# Patient Record
Sex: Female | Born: 1980
Health system: Southern US, Community
[De-identification: ages and names within clinical notes are randomized; demographics above are authoritative.]

## PROBLEM LIST (undated history)

## (undated) DIAGNOSIS — K219 Gastro-esophageal reflux disease without esophagitis: Secondary | ICD-10-CM

## (undated) DIAGNOSIS — N912 Amenorrhea, unspecified: Secondary | ICD-10-CM

## (undated) DIAGNOSIS — R112 Nausea with vomiting, unspecified: Secondary | ICD-10-CM

## (undated) DIAGNOSIS — M3500 Sicca syndrome, unspecified: Secondary | ICD-10-CM

## (undated) DIAGNOSIS — N920 Excessive and frequent menstruation with regular cycle: Secondary | ICD-10-CM

## (undated) DIAGNOSIS — T8859XA Other complications of anesthesia, initial encounter: Secondary | ICD-10-CM

## (undated) DIAGNOSIS — T4145XA Adverse effect of unspecified anesthetic, initial encounter: Secondary | ICD-10-CM

## (undated) DIAGNOSIS — G43109 Migraine with aura, not intractable, without status migrainosus: Secondary | ICD-10-CM

## (undated) DIAGNOSIS — T753XXA Motion sickness, initial encounter: Secondary | ICD-10-CM

## (undated) DIAGNOSIS — R102 Pelvic and perineal pain: Secondary | ICD-10-CM

## (undated) DIAGNOSIS — R51 Headache: Secondary | ICD-10-CM

## (undated) DIAGNOSIS — E282 Polycystic ovarian syndrome: Secondary | ICD-10-CM

## (undated) DIAGNOSIS — N200 Calculus of kidney: Secondary | ICD-10-CM

## (undated) DIAGNOSIS — M545 Low back pain: Secondary | ICD-10-CM

## (undated) DIAGNOSIS — Z8489 Family history of other specified conditions: Secondary | ICD-10-CM

## (undated) DIAGNOSIS — F419 Anxiety disorder, unspecified: Secondary | ICD-10-CM

## (undated) DIAGNOSIS — O021 Missed abortion: Secondary | ICD-10-CM

## (undated) DIAGNOSIS — R519 Headache, unspecified: Secondary | ICD-10-CM

## (undated) DIAGNOSIS — J45909 Unspecified asthma, uncomplicated: Secondary | ICD-10-CM

## (undated) DIAGNOSIS — Z973 Presence of spectacles and contact lenses: Secondary | ICD-10-CM

## (undated) DIAGNOSIS — Z9889 Other specified postprocedural states: Secondary | ICD-10-CM

## (undated) HISTORY — DX: Amenorrhea, unspecified: N91.2

## (undated) HISTORY — PX: NASAL SEPTUM SURGERY: SHX37

## (undated) HISTORY — PX: TYMPANOSTOMY TUBE PLACEMENT: SHX32

## (undated) HISTORY — DX: Missed abortion: O02.1

## (undated) HISTORY — PX: WISDOM TOOTH EXTRACTION: SHX21

## (undated) HISTORY — DX: Calculus of kidney: N20.0

## (undated) HISTORY — DX: Headache, unspecified: R51.9

## (undated) HISTORY — DX: Migraine with aura, not intractable, without status migrainosus: G43.109

## (undated) HISTORY — DX: Headache: R51

## (undated) HISTORY — DX: Polycystic ovarian syndrome: E28.2

## (undated) HISTORY — DX: Pelvic and perineal pain: R10.2

## (undated) HISTORY — DX: Excessive and frequent menstruation with regular cycle: N92.0

## (undated) HISTORY — DX: Anxiety disorder, unspecified: F41.9

---
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
## 2007-11-26 ENCOUNTER — Ambulatory Visit: Payer: Self-pay

## 2010-10-29 ENCOUNTER — Emergency Department: Payer: Self-pay | Admitting: Emergency Medicine

## 2011-03-23 ENCOUNTER — Ambulatory Visit: Payer: Self-pay | Admitting: Otolaryngology

## 2012-06-14 ENCOUNTER — Emergency Department: Payer: Self-pay | Admitting: Emergency Medicine

## 2012-06-14 LAB — COMPREHENSIVE METABOLIC PANEL
Alkaline Phosphatase: 67 U/L (ref 50–136)
Anion Gap: 7 (ref 7–16)
BUN: 10 mg/dL (ref 7–18)
Calcium, Total: 9.1 mg/dL (ref 8.5–10.1)
Chloride: 105 mmol/L (ref 98–107)
EGFR (Non-African Amer.): >60
Glucose: 125 mg/dL — ABNORMAL HIGH (ref 65–99)
Osmolality: 274 (ref 275–301)
Potassium: 3.6 mmol/L (ref 3.5–5.1)
SGPT (ALT): 35 U/L (ref 12–78)

## 2012-06-14 LAB — URINALYSIS, COMPLETE
Bilirubin,UR: NEGATIVE
Blood: NEGATIVE
Nitrite: NEGATIVE
RBC,UR: 1 /HPF (ref 0–5)
Squamous Epithelial: 7
WBC UR: 2 /HPF (ref 0–5)

## 2012-06-14 LAB — CBC
HCT: 42.1 % (ref 35.0–47.0)
HGB: 14.6 g/dL (ref 12.0–16.0)
Platelet: 279 10*3/uL (ref 150–440)
RBC: 4.88 10*6/uL (ref 3.80–5.20)
RDW: 12.7 % (ref 11.5–14.5)
WBC: 7 10*3/uL (ref 3.6–11.0)

## 2012-06-14 LAB — TSH: Thyroid Stimulating Horm: 1.17 u[IU]/mL

## 2013-08-21 ENCOUNTER — Emergency Department: Payer: Self-pay | Admitting: Emergency Medicine

## 2014-06-03 ENCOUNTER — Ambulatory Visit
Admission: RE | Admit: 2014-06-03 | Discharge: 2014-06-03 | Disposition: A | Discharge status: Home / Self Care | Payer: BLUE CROSS/BLUE SHIELD | Source: Ambulatory Visit | Attending: Allergy | Admitting: Allergy

## 2014-06-03 ENCOUNTER — Other Ambulatory Visit: Payer: Self-pay | Admitting: Allergy

## 2014-06-03 DIAGNOSIS — J4530 Mild persistent asthma, uncomplicated: Secondary | ICD-10-CM

## 2016-09-05 ENCOUNTER — Ambulatory Visit: Payer: Self-pay | Admitting: Obstetrics and Gynecology

## 2016-10-04 ENCOUNTER — Ambulatory Visit: Payer: Self-pay | Admitting: Obstetrics and Gynecology

## 2016-10-12 ENCOUNTER — Ambulatory Visit (INDEPENDENT_AMBULATORY_CARE_PROVIDER_SITE_OTHER): Payer: BLUE CROSS/BLUE SHIELD | Admitting: Obstetrics and Gynecology

## 2016-10-12 ENCOUNTER — Encounter: Payer: Self-pay | Admitting: Obstetrics and Gynecology

## 2016-10-12 VITALS — BP 130/90 | HR 96 | Ht 61.0 in | Wt 189.0 lb

## 2016-10-12 DIAGNOSIS — Z30431 Encounter for routine checking of intrauterine contraceptive device: Secondary | ICD-10-CM

## 2016-10-12 DIAGNOSIS — Z01419 Encounter for gynecological examination (general) (routine) without abnormal findings: Secondary | ICD-10-CM

## 2016-10-12 DIAGNOSIS — N921 Excessive and frequent menstruation with irregular cycle: Secondary | ICD-10-CM

## 2016-10-12 NOTE — Progress Notes (Signed)
PCP:  Patient, No Pcp Per   Chief Complaint  Patient presents with  . Gynecologic Exam     HPI:      Eileen Garcia is a 36 y.o. G4P0040 who LMP was Patient's last menstrual period was 09/26/2016., presents today for her annual examination.  Her menses are regular every 28-30 days, lasting 12-14 days.  Dysmenorrhea moderate, occurring premenstrually and during period. Pt takes tylenol with sx relief.  She does nothave intermenstrual bleeding. She has a hx of PCOS.[name] placed due to anovulatory bleeding/menorrhagia with Dr. Laurey Morale. Suspicion of endometriosis given hx of pelvic pain. Failed mult OCPs, ortho evra and depo. Hx of migraines with aura.   Sex activity: single partner, contraception - IUD. Skyla placed 05/25/15. Pt with recent dyspareunia before orgasm. She may want to conceive in near future. Tried metformin in the past for ovulation with hypoglycemia side effects and no effect on menses.   Last Pap: December 22, 2014  Results were: no abnormalities /neg HPV DNA  Hx of STDs: none  There is no FH of breast cancer. There is a FH of uterine vs ovarian cancer in PGM (no chemo/rad done), genetic testing not done. The patient does do self-breast exams.  Tobacco use: The patient denies current or previous tobacco use. Alcohol use: social drinker No drug use.  Exercise: moderately active  She does not get adequate calcium and Vitamin D in her diet.   Past Medical History:  Diagnosis Date  . Amenorrhea   . Anxiety   . Headache   . Menorrhagia   . Migraine with aura   . Missed abortion   . Pelvic pain   . Polycystic ovaries     Past Surgical History:  Procedure Laterality Date  . WISDOM TOOTH EXTRACTION      Family History  Problem Relation Age of Onset  . Hypertension Father   . Ovarian cancer Paternal Grandmother 16       vs uterine--no chemo/rad done  . Migraines Paternal Grandmother   . Diabetes Paternal Grandmother     Social History   Social  History  . Marital status: Single    Spouse name: N/A  . Number of children: N/A  . Years of education: N/A   Occupational History  . Not on file.   Social History Main Topics  . Smoking status: Never Smoker  . Smokeless tobacco: Never Used  . Alcohol use Yes  . Drug use: No  . Sexual activity: Yes    Birth control/ protection: IUD   Other Topics Concern  . Not on file   Social History Narrative  . No narrative on file    Current Meds  Medication Sig  . EPINEPHrine (EPIPEN 2-PAK) 0.3 mg/0.3 mL IJ SOAJ injection Reported on 03/29/2015  . Levonorgestrel 13.5 MG IUD 13.5 mg by Intrauterine route.  . [DISCONTINUED] levonorgestrel (MIRENA) 20 MCG/24HR IUD 1 each by Intrauterine route once.     ROS:  Review of Systems  Constitutional: Positive for fatigue. Negative for fever and unexpected weight change.  Respiratory: Negative for cough, shortness of breath and wheezing.   Cardiovascular: Negative for chest pain, palpitations and leg swelling.  Gastrointestinal: Negative for blood in stool, constipation, diarrhea, nausea and vomiting.  Endocrine: Negative for cold intolerance, heat intolerance and polyuria.  Genitourinary: Positive for dyspareunia and pelvic pain. Negative for dysuria, flank pain, frequency, genital sores, hematuria, menstrual problem, urgency, vaginal bleeding, vaginal discharge and vaginal pain.  Musculoskeletal: Negative for  back pain, joint swelling and myalgias.  Skin: Negative for rash.  Neurological: Negative for dizziness, syncope, light-headedness, numbness and headaches.  Hematological: Negative for adenopathy.  Psychiatric/Behavioral: Negative for agitation, confusion, sleep disturbance and suicidal ideas. The patient is not nervous/anxious.      Objective: BP 130/90   Pulse 96   Ht 5\' 1"  (1.549 m)   Wt 189 lb (85.7 kg)   LMP 09/26/2016   BMI 35.71 kg/m    Physical Exam  Constitutional: She is oriented to person, place, and time. She  appears well-developed and well-nourished.  Genitourinary: Vagina normal and uterus normal. There is no rash, tenderness, lesion or Bartholin's cyst on the right labia. There is no rash, tenderness, lesion or Bartholin's cyst on the left labia. No erythema, tenderness or bleeding in the vagina. No vaginal discharge found. Right adnexum does not display mass and does not display tenderness. Left adnexum does not display mass and does not display tenderness.  Cervix exhibits visible IUD strings. Cervix does not exhibit motion tenderness, discharge, friability or polyp. Uterus is not enlarged or tender.  Neck: Normal range of motion. No thyromegaly present.  Cardiovascular: Normal rate, regular rhythm and normal heart sounds.   No murmur heard. Pulmonary/Chest: Effort normal and breath sounds normal. Right breast exhibits no mass, no nipple discharge, no skin change and no tenderness. Left breast exhibits no mass, no nipple discharge, no skin change and no tenderness.  Abdominal: Soft. There is no tenderness. There is no guarding.  Musculoskeletal: Normal range of motion.  Neurological: She is alert and oriented to person, place, and time. No cranial nerve deficit.  Psychiatric: She has a normal mood and affect. Her behavior is normal.  Nursing note and vitals reviewed.     Assessment/Plan: Encounter for annual routine gynecological examination  Encounter for routine checking of intrauterine contraceptive device (IUD) - IUD in place. Due for rem 4/20.  Menometrorrhagia - Sx improved with IUD.           GYN counsel adequate intake of calcium and vitamin D, diet and exercise     F/U  Return in about 1 year (around 10/12/2017).  Aminah Zabawa B. Gatlin Kittell, PA-C 10/12/2016 9:07 AM

## 2016-11-13 ENCOUNTER — Telehealth: Payer: Self-pay

## 2016-11-13 NOTE — Telephone Encounter (Signed)
This can be normal for [facility]. Let's see if sx persist before giving estrogen OCPs due to hx of migraines with aura. RN to notify pt.

## 2016-11-13 NOTE — Telephone Encounter (Signed)
Pt was seen on 9/6 after a 2wk period, after 9/6 started period for 2wk, stopped for about 1 1/2 -2wks, and has started bleeding agaoin.  The last time this happened she was give an rx for a few bcp to regulate bleeding.  If this can be done again - GREAT!  [facility] [place].  [phone]

## 2016-11-14 ENCOUNTER — Other Ambulatory Visit: Payer: Self-pay | Admitting: Obstetrics and Gynecology

## 2016-11-14 MED ORDER — ESTRADIOL 1 MG PO TABS
1.0000 mg | ORAL_TABLET | Freq: Every day | ORAL | 0 refills | Status: DC
Start: 2016-11-14 — End: 2017-07-19

## 2016-11-14 NOTE — Telephone Encounter (Signed)
Pt states that she was only on the estrogen for a few days over a short period of time and that regulated the periods. Pt also c/o increased and painful cramping. Pt is leaving for [place] tomorrow morning for 5 days and concerned. Bleeding was very heavy and painful 9/23-9/28. Please advise if there is anything else that can be done. Thank you.

## 2016-11-14 NOTE — Telephone Encounter (Signed)
Rx estradiol eRxd. 14 days worth, but take until bleeding stops. RTO if sx persist after 14 tx with no relief. RN to discuss with pt.

## 2016-11-14 NOTE — Progress Notes (Signed)
Rx estradiol for irreg bleeding with Skyla. Helped in the past. Hx of migraines with aura. Use temporarily. F/u if sx persist.

## 2016-11-14 NOTE — Telephone Encounter (Signed)
Pt aware and appreciative  

## 2016-12-14 ENCOUNTER — Ambulatory Visit (INDEPENDENT_AMBULATORY_CARE_PROVIDER_SITE_OTHER): Payer: BLUE CROSS/BLUE SHIELD | Admitting: Obstetrics and Gynecology

## 2016-12-14 ENCOUNTER — Encounter: Payer: Self-pay | Admitting: Obstetrics and Gynecology

## 2016-12-14 VITALS — BP 130/90 | HR 91 | Ht 61.0 in | Wt 189.0 lb

## 2016-12-14 DIAGNOSIS — Z30431 Encounter for routine checking of intrauterine contraceptive device: Secondary | ICD-10-CM | POA: Diagnosis not present

## 2016-12-14 DIAGNOSIS — N921 Excessive and frequent menstruation with irregular cycle: Secondary | ICD-10-CM | POA: Diagnosis not present

## 2016-12-14 DIAGNOSIS — N938 Other specified abnormal uterine and vaginal bleeding: Secondary | ICD-10-CM | POA: Diagnosis not present

## 2016-12-14 DIAGNOSIS — E282 Polycystic ovarian syndrome: Secondary | ICD-10-CM | POA: Insufficient documentation

## 2016-12-14 NOTE — Progress Notes (Signed)
Chief Complaint  Patient presents with  . aub    HPI:      [name] is a 36 y.o. G4P0040 who LMP was Patient's last menstrual period was 12/12/2016., presents today for DUB sx again. She has a long hx of menometrorrhagia/irreg menses, since age 42, due to PCOS. Pt had skyla placed 4/17 for sx and was doing fairly well until 9/18 when she started having Q3 wks of bleeding for about 10 days. Flow is light/medium (changing unfilled pads BID to TID). Pt tried estrogen 1 mg for 14 days without any real change. Pt with hx of migraines with aura so have to limit estrogen use. Pt failed multiple OCPs, xulane and depo in the past. She may want to conceive in the next 2 yrs, so fertility preservation is preferred.   Pt has also noticed increased pelvic pain/dysmen recently, sx improved with NSAIDs. Pt with hx of pelvic pain in the past and endometriosis is suspected by Dr. Glennon Mac. She has seen Dr. Laurey Morale, Glennon Mac and Pinch in the past. GYN u/s 10/17 was normal.   Last annual 9/18. No recent DUB labs done.  Past Medical History:  Diagnosis Date  . Amenorrhea   . Anxiety   . Headache   . Menorrhagia   . Migraine with aura   . Missed abortion   . Pelvic pain   . Polycystic ovaries     Past Surgical History:  Procedure Laterality Date  . WISDOM TOOTH EXTRACTION      Family History  Problem Relation Age of Onset  . Hypertension Father   . Ovarian cancer Paternal Grandmother 38       vs uterine--no chemo/rad done  . Migraines Paternal Grandmother   . Diabetes Paternal Grandmother     Social History   Socioeconomic History  . Marital status: Single    Spouse name: Not on file  . Number of children: Not on file  . Years of education: Not on file  . Highest education level: Not on file  Social Needs  . Financial resource strain: Not on file  . Food insecurity - worry: Not on file  . Food insecurity - inability: Not on file  . Transportation needs - medical: Not on  file  . Transportation needs - non-medical: Not on file  Occupational History  . Not on file  Tobacco Use  . Smoking status: Never Smoker  . Smokeless tobacco: Never Used  Substance and Sexual Activity  . Alcohol use: Yes  . Drug use: No  . Sexual activity: Yes    Birth control/protection: IUD  Other Topics Concern  . Not on file  Social History Narrative  . Not on file     Current Outpatient Medications:  .  cetirizine (ZYRTEC) 10 MG tablet, Take by mouth., Disp: , Rfl:  .  EPINEPHrine (EPIPEN 2-PAK) 0.3 mg/0.3 mL IJ SOAJ injection, Reported on 03/29/2015, Disp: , Rfl:  .  estradiol (ESTRACE) 1 MG tablet, Take 1 tablet (1 mg total) by mouth daily., Disp: 14 tablet, Rfl: 0 .  Levonorgestrel 13.5 MG IUD, 13.5 mg by Intrauterine route., Disp: , Rfl:  .  montelukast (SINGULAIR) 10 MG tablet, Take by mouth., Disp: , Rfl:    ROS:  Review of Systems  Constitutional: Negative for fever.  Gastrointestinal: Negative for blood in stool, constipation, diarrhea, nausea and vomiting.  Genitourinary: Positive for menstrual problem and pelvic pain. Negative for dyspareunia, dysuria, flank pain, frequency, hematuria, urgency, vaginal bleeding, vaginal  discharge and vaginal pain.  Musculoskeletal: Negative for back pain.  Skin: Negative for rash.     OBJECTIVE:   Vitals:  BP 130/90   Pulse 91   Ht 5\' 1"  (1.549 m)   Wt 189 lb (85.7 kg)   LMP 12/12/2016   BMI 35.71 kg/m   Physical Exam  Constitutional: She is oriented to person, place, and time and well-developed, well-nourished, and in no distress.  Neurological: She is alert and oriented to person, place, and time.  Psychiatric: Memory, affect and judgment normal.  Vitals reviewed. GYN EXAM DEFERRED SINCE DONE 9/18 AT ANNUAL   Assessment/Plan: DUB (dysfunctional uterine bleeding) - Sx in the past, recurred since 9/18. Has skyla IUD. CHeck u/s for placement/uterine path. Chck labs. If neg, will try camila OCPs. - Plan: TSH +  free T4, Hemoglobin A1c, Prolactin, US PELVIS TRANSVANGINAL NON-OB (TV ONLY)  Menometrorrhagia - Flow improved wtih Skyla, but has poor cycle control.   PCOS (polycystic ovarian syndrome)  Encounter for routine checking of intrauterine contraceptive device (IUD) - Plan: US PELVIS TRANSVANGINAL NON-OB (TV ONLY)    Return in about 1 day (around 12/15/2016) for GYN u/s for DUB--[facility] to call pt.  Alicia B. Copland, PA-C 12/14/2016 11:37 AM

## 2016-12-14 NOTE — Patient Instructions (Signed)
I value your feedback and appreciate you entrusting us with your care. If you get a [facility] patient survey, I would appreciate you taking the time to let us know what your experience was like. Thank you! 

## 2016-12-15 LAB — PROLACTIN: PROLACTIN: 11.9 ng/mL (ref 4.8–23.3)

## 2016-12-15 LAB — HEMOGLOBIN A1C
Est. average glucose Bld gHb Est-mCnc: 105 mg/dL
Hgb A1c MFr Bld: 5.3 % (ref 4.8–5.6)

## 2016-12-15 LAB — TSH+FREE T4
Free T4: 1.42 ng/dL (ref 0.82–1.77)
TSH: 1.63 u[IU]/mL (ref 0.450–4.500)

## 2016-12-18 ENCOUNTER — Ambulatory Visit: Payer: BLUE CROSS/BLUE SHIELD

## 2016-12-18 ENCOUNTER — Telehealth: Payer: Self-pay | Admitting: Obstetrics and Gynecology

## 2016-12-18 DIAGNOSIS — Z30431 Encounter for routine checking of intrauterine contraceptive device: Secondary | ICD-10-CM

## 2016-12-18 DIAGNOSIS — N938 Other specified abnormal uterine and vaginal bleeding: Secondary | ICD-10-CM

## 2016-12-18 MED ORDER — NORETHINDRONE 0.35 MG PO TABS: 1.0000 | ORAL_TABLET | Freq: Every day | ORAL | 2 refills | Status: DC

## 2016-12-18 NOTE — Telephone Encounter (Signed)
Pt aware of normal DUB labs and IUD in correct place/neg u/s. Will try camila OCPs. Rx eRxd. Pt to f/u via phone with sx.

## 2017-03-09 ENCOUNTER — Telehealth: Payer: Self-pay | Admitting: Obstetrics and Gynecology

## 2017-03-09 NOTE — Telephone Encounter (Signed)
Pt is calling to let Elmo Putt know that she is doing well on her Birthcontrol and would like to keep that going. Please refill.

## 2017-03-10 ENCOUNTER — Other Ambulatory Visit: Payer: Self-pay | Admitting: Obstetrics and Gynecology

## 2017-03-12 ENCOUNTER — Other Ambulatory Visit: Payer: Self-pay | Admitting: Obstetrics and Gynecology

## 2017-03-12 NOTE — Telephone Encounter (Signed)
Pt's DUB sx with IUD are much improved but cont with camila OCPs daily. Had neg u/s 11/18. Wants to continue POPs. Still has pelvic pain but that is normal for pt. Has failed mult BC options due to bleeding irregularities. Just wants to preserve fertility for a few more yrs until she wants to conceive. Rx RF camila. F/u prn.

## 2017-06-18 DIAGNOSIS — G43719 Chronic migraine without aura, intractable, without status migrainosus: Secondary | ICD-10-CM | POA: Diagnosis not present

## 2017-06-18 DIAGNOSIS — G43839 Menstrual migraine, intractable, without status migrainosus: Secondary | ICD-10-CM | POA: Diagnosis not present

## 2017-06-18 DIAGNOSIS — R51 Headache: Secondary | ICD-10-CM | POA: Diagnosis not present

## 2017-06-18 DIAGNOSIS — M791 Myalgia, unspecified site: Secondary | ICD-10-CM | POA: Diagnosis not present

## 2017-06-18 DIAGNOSIS — G500 Trigeminal neuralgia: Secondary | ICD-10-CM | POA: Diagnosis not present

## 2017-06-18 DIAGNOSIS — M542 Cervicalgia: Secondary | ICD-10-CM | POA: Diagnosis not present

## 2017-06-18 DIAGNOSIS — G518 Other disorders of facial nerve: Secondary | ICD-10-CM | POA: Diagnosis not present

## 2017-06-18 DIAGNOSIS — G509 Disorder of trigeminal nerve, unspecified: Secondary | ICD-10-CM | POA: Diagnosis not present

## 2017-07-19 ENCOUNTER — Ambulatory Visit: Payer: BLUE CROSS/BLUE SHIELD | Admitting: Obstetrics and Gynecology

## 2017-07-19 ENCOUNTER — Ambulatory Visit (INDEPENDENT_AMBULATORY_CARE_PROVIDER_SITE_OTHER): Payer: BLUE CROSS/BLUE SHIELD

## 2017-07-19 ENCOUNTER — Encounter: Payer: Self-pay | Admitting: Obstetrics and Gynecology

## 2017-07-19 VITALS — BP 120/90 | HR 90 | Ht 61.0 in | Wt 196.0 lb

## 2017-07-19 DIAGNOSIS — R102 Pelvic and perineal pain: Secondary | ICD-10-CM | POA: Diagnosis not present

## 2017-07-19 DIAGNOSIS — R140 Abdominal distension (gaseous): Secondary | ICD-10-CM

## 2017-07-19 DIAGNOSIS — R600 Localized edema: Secondary | ICD-10-CM | POA: Diagnosis not present

## 2017-07-19 DIAGNOSIS — N941 Unspecified dyspareunia: Secondary | ICD-10-CM

## 2017-07-19 NOTE — Patient Instructions (Signed)
I value your feedback and entrusting us with your care. If you get a [facility] patient survey, I would appreciate you taking the time to let us know about your experience today. Thank you! 

## 2017-07-19 NOTE — Progress Notes (Signed)
Patient, No Pcp Per   Chief Complaint  Patient presents with  . Gynecologic Exam    bloated x4wk; gained 6 lbs this month alone; cramping    HPI:      [name] is a 37 y.o. G4P0040 who LMP was No LMP recorded (lmp unknown)., presents today for bloating and cramping for past month. Pt notes about a 6# wt increase when bloated. When not bloated, wt returns to normal. Has had swelling in her fingers, too. Pt has daily BMs that are normal for her. No n/v/d/constipation. No vag sx, no urin sx. No fevers. Has occas spotting with IUD/camila, but irreg bleeding improved with addition of camila to IUD. Pt was on abx a few months ago for UTI.  Pt is sex active, uses IUD/POP. No new sex partners.  Had severe dyspareunia a couple days ago.  Skyla placed 05/25/15. Had neg GYN u/s 11/18 with IUD in correct location.    Past Medical History:  Diagnosis Date  . Amenorrhea   . Anxiety   . Headache   . Menorrhagia   . Migraine with aura   . Missed abortion   . Pelvic pain   . Polycystic ovaries     Past Surgical History:  Procedure Laterality Date  . WISDOM TOOTH EXTRACTION      Family History  Problem Relation Age of Onset  . Hypertension Father   . Ovarian cancer Paternal Grandmother 70       vs uterine--no chemo/rad done  . Migraines Paternal Grandmother   . Diabetes Paternal Grandmother     Social History   Socioeconomic History  . Marital status: Married    Spouse name: Not on file  . Number of children: Not on file  . Years of education: Not on file  . Highest education level: Not on file  Occupational History  . Not on file  Social Needs  . Financial resource strain: Not on file  . Food insecurity:    Worry: Not on file    Inability: Not on file  . Transportation needs:    Medical: Not on file    Non-medical: Not on file  Tobacco Use  . Smoking status: Never Smoker  . Smokeless tobacco: Never Used  Substance and Sexual Activity  . Alcohol use: Yes    . Drug use: No  . Sexual activity: Yes    Birth control/protection: IUD  Lifestyle  . Physical activity:    Days per week: Not on file    Minutes per session: Not on file  . Stress: Not on file  Relationships  . Social connections:    Talks on phone: Not on file    Gets together: Not on file    Attends religious service: Not on file    Active member of club or organization: Not on file    Attends meetings of clubs or organizations: Not on file    Relationship status: Not on file  . Intimate partner violence:    Fear of current or ex partner: Not on file    Emotionally abused: Not on file    Physically abused: Not on file    Forced sexual activity: Not on file  Other Topics Concern  . Not on file  Social History Narrative  . Not on file    Outpatient Medications Prior to Visit  Medication Sig Dispense Refill  . albuterol (PROVENTIL HFA) 108 (90 Base) MCG/ACT inhaler Inhale 2 puffs into the lungs  as needed.    . cetirizine (ZYRTEC) 10 MG tablet Take by mouth.    . EPINEPHrine (EPIPEN 2-PAK) 0.3 mg/0.3 mL IJ SOAJ injection Reported on 03/29/2015    . Levonorgestrel 13.5 MG IUD 13.5 mg by Intrauterine route.    . montelukast (SINGULAIR) 10 MG tablet Take by mouth.    . norethindrone (MICRONOR,CAMILA,ERRIN) 0.35 MG tablet TAKE 1 TABLET (0.35 MG TOTAL) DAILY BY MOUTH. 84 tablet 2  . estradiol (ESTRACE) 1 MG tablet Take 1 tablet (1 mg total) by mouth daily. 14 tablet 0   No facility-administered medications prior to visit.      ROS:  Review of Systems  Constitutional: Negative for fever.  Gastrointestinal: Positive for abdominal distention and abdominal pain. Negative for blood in stool, constipation, diarrhea, nausea and vomiting.  Genitourinary: Positive for dyspareunia and pelvic pain. Negative for dysuria, flank pain, frequency, hematuria, urgency, vaginal bleeding, vaginal discharge and vaginal pain.  Musculoskeletal: Negative for back pain.  Skin: Negative for rash.     OBJECTIVE:   Vitals:  BP 120/90   Pulse 90   Ht 5\' 1"  (1.549 m)   Wt 196 lb (88.9 kg)   LMP  (LMP Unknown)   BMI 37.03 kg/m   Physical Exam  Constitutional: She is oriented to person, place, and time. Vital signs are normal. She appears well-developed.  Pulmonary/Chest: Effort normal.  Abdominal: Soft. Normal appearance. There is generalized tenderness. There is no rigidity and no guarding.  Genitourinary: Vagina normal and uterus normal. There is no rash, tenderness or lesion on the right labia. There is no rash, tenderness or lesion on the left labia. Uterus is not enlarged and not tender. Cervix exhibits no motion tenderness. Right adnexum displays no mass and no tenderness. Left adnexum displays no mass and no tenderness. No erythema or tenderness in the vagina. No vaginal discharge found.  Genitourinary Comments: IUD STRINGS IN PLACE  Musculoskeletal: Normal range of motion.  Neurological: She is alert and oriented to person, place, and time.  Psychiatric: She has a normal mood and affect. Her behavior is normal. Thought content normal.  Vitals reviewed.  RESULTS:  Results for orders placed or performed in visit on 07/19/17 (from the past 24 hour(s))  POCT Urinalysis Dipstick     Status: Normal   Collection Time: 07/23/17  5:25 PM  Result Value Ref Range   Color, UA yellow    Clarity, UA clear    Glucose, UA Negative Negative   Bilirubin, UA neg    Ketones, UA neg    Spec Grav, UA 1.015 1.010 - 1.025   Blood, UA neg    pH, UA 6.5 5.0 - 8.0   Protein, UA Negative Negative   Urobilinogen, UA  0.2 or 1.0 E.U./dL   Nitrite, UA neg    Leukocytes, UA Negative Negative   Appearance     Odor      ULTRASOUND REPORT  Patient Name: Eileen Garcia DOB: [birth year] MRN: [id number]  Location: [facility]  Date of Service: 07/19/2017    Indications:IUD location Findings:  The uterus is anteverted and measures 6.99 x 3.92 x 3.81cm. Echo texture is homogenous  without evidence of focal masses.  The Endometrium measures not visible d/t IUD.  Right Ovary measures 3.24 x 3.47 x 2.92 cm. It is normal in appearance with a dominant follicle Left Ovary measures 2.14 x 1.33 x 1.06 cm. It is normal in appearance. Survey of the adnexa demonstrates no adnexal masses. There is no free  fluid in the cul de sac.  Impression: 1. Normal gyn ultrasound 2. IUD in correct location  Recommendations: 1.Clinical correlation with the patient's History and Physical Exam.   Edwena Bunde, RDMS, RVT    Assessment/Plan: Pelvic cramping - IUD strings in place. Neg GYN u/s. Most likely GI. Checking labs. Will f/u with resutls.  - Plan: POCT Urinalysis Dipstick, US PELVIS TRANSVANGINAL NON-OB (TV ONLY)  Bloating - If u/s neg, most likely GI etiology. Add probiotics/increase fiber to see if sx improve. If not, f/u wiht GI. - Plan: Comprehensive metabolic panel, TSH, Hemoglobin A1c  Dyspareunia in female - Most likely due to bloating. Neg u/s.   Localized edema - In fingers with bloating/wt change. Check labs. Will f/u with results.  - Plan: Comprehensive metabolic panel, TSH, Hemoglobin A1c    Return for GYN u/s today for pelvic pain/IUD check.  Brynlie Daza B. Devika Dragovich, PA-C 07/23/2017 5:24 PM

## 2017-07-20 ENCOUNTER — Telehealth: Payer: Self-pay | Admitting: Obstetrics and Gynecology

## 2017-07-20 LAB — COMPREHENSIVE METABOLIC PANEL
A/G RATIO: 1.6 (ref 1.2–2.2)
ALBUMIN: 4.2 g/dL (ref 3.5–5.5)
ALT: 18 IU/L (ref 0–32)
AST: 16 IU/L (ref 0–40)
Alkaline Phosphatase: 55 IU/L (ref 39–117)
BILIRUBIN TOTAL: 0.4 mg/dL (ref 0.0–1.2)
BUN / CREAT RATIO: 14 (ref 9–23)
BUN: 10 mg/dL (ref 6–20)
CHLORIDE: 102 mmol/L (ref 96–106)
CO2: 23 mmol/L (ref 20–29)
Calcium: 9.5 mg/dL (ref 8.7–10.2)
Creatinine, Ser: 0.73 mg/dL (ref 0.57–1.00)
GFR calc non Af Amer: 107 mL/min/{1.73_m2} (ref >59)
GFR, EST AFRICAN AMERICAN: 123 mL/min/{1.73_m2} (ref >59)
GLOBULIN, TOTAL: 2.6 g/dL (ref 1.5–4.5)
GLUCOSE: 97 mg/dL (ref 65–99)
Potassium: 4.4 mmol/L (ref 3.5–5.2)
SODIUM: 140 mmol/L (ref 134–144)
Total Protein: 6.8 g/dL (ref 6.0–8.5)

## 2017-07-20 LAB — HEMOGLOBIN A1C
Est. average glucose Bld gHb Est-mCnc: 103 mg/dL
Hgb A1c MFr Bld: 5.2 % (ref 4.8–5.6)

## 2017-07-20 LAB — TSH: TSH: 1.18 u[IU]/mL (ref 0.450–4.500)

## 2017-07-20 NOTE — Telephone Encounter (Signed)
Please call with ultrasound results.

## 2017-07-23 ENCOUNTER — Encounter: Payer: Self-pay | Admitting: Obstetrics and Gynecology

## 2017-07-23 LAB — POCT URINALYSIS DIPSTICK
Bilirubin, UA: NEGATIVE
Blood, UA: NEGATIVE
Glucose, UA: NEGATIVE
Ketones, UA: NEGATIVE
Leukocytes, UA: NEGATIVE
Nitrite, UA: NEGATIVE
Protein, UA: NEGATIVE
Spec Grav, UA: 1.015 (ref 1.010–1.025)
pH, UA: 6.5 (ref 5.0–8.0)

## 2017-07-23 NOTE — Telephone Encounter (Signed)
Pt aware of neg GYN u/s and labs for bloating. Has started fiber supp and lots of water. Has swelling in hands and feet but eating more sodium in restaurant/prepped foods recently due to kitchen remodeling. Decrease sodium. F/u with PCP if sx persist. No GYN etiology.

## 2017-09-17 DIAGNOSIS — G518 Other disorders of facial nerve: Secondary | ICD-10-CM | POA: Diagnosis not present

## 2017-09-17 DIAGNOSIS — G43839 Menstrual migraine, intractable, without status migrainosus: Secondary | ICD-10-CM | POA: Diagnosis not present

## 2017-09-17 DIAGNOSIS — M791 Myalgia, unspecified site: Secondary | ICD-10-CM | POA: Diagnosis not present

## 2017-09-17 DIAGNOSIS — M542 Cervicalgia: Secondary | ICD-10-CM | POA: Diagnosis not present

## 2017-09-17 DIAGNOSIS — R51 Headache: Secondary | ICD-10-CM | POA: Diagnosis not present

## 2017-09-17 DIAGNOSIS — G43719 Chronic migraine without aura, intractable, without status migrainosus: Secondary | ICD-10-CM | POA: Diagnosis not present

## 2017-09-26 DIAGNOSIS — G43839 Menstrual migraine, intractable, without status migrainosus: Secondary | ICD-10-CM | POA: Diagnosis not present

## 2017-09-26 DIAGNOSIS — G43719 Chronic migraine without aura, intractable, without status migrainosus: Secondary | ICD-10-CM | POA: Diagnosis not present

## 2017-11-06 DIAGNOSIS — D239 Other benign neoplasm of skin, unspecified: Secondary | ICD-10-CM

## 2017-11-06 HISTORY — DX: Other benign neoplasm of skin, unspecified: D23.9

## 2017-11-10 ENCOUNTER — Other Ambulatory Visit: Payer: Self-pay | Admitting: Obstetrics and Gynecology

## 2017-11-12 ENCOUNTER — Telehealth: Payer: Self-pay

## 2017-11-12 ENCOUNTER — Other Ambulatory Visit: Payer: Self-pay | Admitting: Obstetrics and Gynecology

## 2017-11-12 MED ORDER — NORETHINDRONE 0.35 MG PO TABS: 1.0000 | ORAL_TABLET | Freq: Every day | ORAL | 0 refills | Status: DC

## 2017-11-12 NOTE — Telephone Encounter (Signed)
Pt aware of past due annual and of glucometers. She says she will call back to schedule an appt.

## 2017-11-12 NOTE — Telephone Encounter (Signed)
Pt scheduled apt for AE 12/11/17 which is 2 weeks after she will run out of pills. Pharmacy advised her to contact us to ask for refill to get her to her appt. She was unable to make an appt on a date prior to running out of meds.

## 2017-11-12 NOTE — Progress Notes (Signed)
Rx RF till annual 

## 2017-11-12 NOTE — Telephone Encounter (Signed)
RN to notify pt she is past due for annual. Pls sched and will RF OCPs till appt. As for glucometer, pt to f/u with PCP--we don't write Rx for glucometers.

## 2017-11-12 NOTE — Telephone Encounter (Signed)
Pt was notified by pharmacy that her Rf request of her OCP was denied & she needed to contact the office. Also, She is inquiring if she can get a rx for a glucometer due to her PCOS. She has had a lot of episodes of her glucose bottoming out. She knows how to get it up but she would like to keep track of it. Her last labs were perfect, but she had just had a chicken biscuit, hash brown & mt Dew bc she had bottomed out. She feels like it should've been high considering what she ate. HW#[phone]

## 2017-11-12 NOTE — Telephone Encounter (Signed)
Rx RF camila eRxd till annual

## 2017-11-14 DIAGNOSIS — G518 Other disorders of facial nerve: Secondary | ICD-10-CM | POA: Diagnosis not present

## 2017-11-14 DIAGNOSIS — M791 Myalgia, unspecified site: Secondary | ICD-10-CM | POA: Diagnosis not present

## 2017-11-14 DIAGNOSIS — G43719 Chronic migraine without aura, intractable, without status migrainosus: Secondary | ICD-10-CM | POA: Diagnosis not present

## 2017-11-14 DIAGNOSIS — M542 Cervicalgia: Secondary | ICD-10-CM | POA: Diagnosis not present

## 2017-11-14 DIAGNOSIS — R51 Headache: Secondary | ICD-10-CM | POA: Diagnosis not present

## 2017-11-14 DIAGNOSIS — G43839 Menstrual migraine, intractable, without status migrainosus: Secondary | ICD-10-CM | POA: Diagnosis not present

## 2017-12-03 DIAGNOSIS — L280 Lichen simplex chronicus: Secondary | ICD-10-CM | POA: Diagnosis not present

## 2017-12-03 DIAGNOSIS — D485 Neoplasm of uncertain behavior of skin: Secondary | ICD-10-CM | POA: Diagnosis not present

## 2017-12-03 DIAGNOSIS — B078 Other viral warts: Secondary | ICD-10-CM | POA: Diagnosis not present

## 2017-12-03 DIAGNOSIS — D225 Melanocytic nevi of trunk: Secondary | ICD-10-CM | POA: Diagnosis not present

## 2017-12-03 DIAGNOSIS — D229 Melanocytic nevi, unspecified: Secondary | ICD-10-CM | POA: Diagnosis not present

## 2017-12-04 ENCOUNTER — Ambulatory Visit: Payer: BLUE CROSS/BLUE SHIELD | Admitting: Obstetrics and Gynecology

## 2017-12-07 DIAGNOSIS — T798XXA Other early complications of trauma, initial encounter: Secondary | ICD-10-CM | POA: Diagnosis not present

## 2017-12-07 DIAGNOSIS — D485 Neoplasm of uncertain behavior of skin: Secondary | ICD-10-CM | POA: Diagnosis not present

## 2017-12-07 DIAGNOSIS — L0390 Cellulitis, unspecified: Secondary | ICD-10-CM | POA: Diagnosis not present

## 2017-12-11 ENCOUNTER — Ambulatory Visit: Payer: BLUE CROSS/BLUE SHIELD | Admitting: Obstetrics and Gynecology

## 2017-12-17 DIAGNOSIS — L988 Other specified disorders of the skin and subcutaneous tissue: Secondary | ICD-10-CM | POA: Diagnosis not present

## 2017-12-17 DIAGNOSIS — D485 Neoplasm of uncertain behavior of skin: Secondary | ICD-10-CM | POA: Diagnosis not present

## 2017-12-17 DIAGNOSIS — D229 Melanocytic nevi, unspecified: Secondary | ICD-10-CM

## 2017-12-17 HISTORY — DX: Melanocytic nevi, unspecified: D22.9

## 2017-12-24 DIAGNOSIS — D485 Neoplasm of uncertain behavior of skin: Secondary | ICD-10-CM | POA: Diagnosis not present

## 2018-01-01 DIAGNOSIS — R51 Headache: Secondary | ICD-10-CM | POA: Diagnosis not present

## 2018-01-01 DIAGNOSIS — M542 Cervicalgia: Secondary | ICD-10-CM | POA: Diagnosis not present

## 2018-01-01 DIAGNOSIS — G43839 Menstrual migraine, intractable, without status migrainosus: Secondary | ICD-10-CM | POA: Diagnosis not present

## 2018-01-01 DIAGNOSIS — M791 Myalgia, unspecified site: Secondary | ICD-10-CM | POA: Diagnosis not present

## 2018-01-01 DIAGNOSIS — G518 Other disorders of facial nerve: Secondary | ICD-10-CM | POA: Diagnosis not present

## 2018-01-01 DIAGNOSIS — G43719 Chronic migraine without aura, intractable, without status migrainosus: Secondary | ICD-10-CM | POA: Diagnosis not present

## 2018-02-09 DIAGNOSIS — R1084 Generalized abdominal pain: Secondary | ICD-10-CM | POA: Diagnosis not present

## 2018-02-09 DIAGNOSIS — R1031 Right lower quadrant pain: Secondary | ICD-10-CM | POA: Diagnosis not present

## 2018-02-09 DIAGNOSIS — N809 Endometriosis, unspecified: Secondary | ICD-10-CM | POA: Diagnosis not present

## 2018-02-09 DIAGNOSIS — R112 Nausea with vomiting, unspecified: Secondary | ICD-10-CM | POA: Diagnosis not present

## 2018-02-09 DIAGNOSIS — R350 Frequency of micturition: Secondary | ICD-10-CM | POA: Diagnosis not present

## 2018-02-09 DIAGNOSIS — N201 Calculus of ureter: Secondary | ICD-10-CM | POA: Diagnosis not present

## 2018-02-09 DIAGNOSIS — R1030 Lower abdominal pain, unspecified: Secondary | ICD-10-CM | POA: Diagnosis not present

## 2018-02-09 DIAGNOSIS — G43909 Migraine, unspecified, not intractable, without status migrainosus: Secondary | ICD-10-CM | POA: Diagnosis not present

## 2018-02-09 DIAGNOSIS — E282 Polycystic ovarian syndrome: Secondary | ICD-10-CM | POA: Diagnosis not present

## 2018-02-09 DIAGNOSIS — F17200 Nicotine dependence, unspecified, uncomplicated: Secondary | ICD-10-CM | POA: Diagnosis not present

## 2018-02-09 DIAGNOSIS — N202 Calculus of kidney with calculus of ureter: Secondary | ICD-10-CM | POA: Diagnosis not present

## 2018-02-09 DIAGNOSIS — N12 Tubulo-interstitial nephritis, not specified as acute or chronic: Secondary | ICD-10-CM | POA: Diagnosis not present

## 2018-02-09 DIAGNOSIS — R32 Unspecified urinary incontinence: Secondary | ICD-10-CM | POA: Diagnosis not present

## 2018-02-11 ENCOUNTER — Encounter: Payer: Self-pay | Admitting: Obstetrics and Gynecology

## 2018-02-11 ENCOUNTER — Other Ambulatory Visit (HOSPITAL_COMMUNITY)
Admission: RE | Admit: 2018-02-11 | Discharge: 2018-02-11 | Disposition: A | Discharge status: Home / Self Care | Payer: BLUE CROSS/BLUE SHIELD | Source: Ambulatory Visit | Attending: Obstetrics and Gynecology | Admitting: Obstetrics and Gynecology

## 2018-02-11 ENCOUNTER — Ambulatory Visit (INDEPENDENT_AMBULATORY_CARE_PROVIDER_SITE_OTHER): Payer: BLUE CROSS/BLUE SHIELD | Admitting: Obstetrics and Gynecology

## 2018-02-11 VITALS — BP 130/82 | Ht 61.0 in | Wt 200.0 lb

## 2018-02-11 DIAGNOSIS — Z01419 Encounter for gynecological examination (general) (routine) without abnormal findings: Secondary | ICD-10-CM

## 2018-02-11 DIAGNOSIS — Z1151 Encounter for screening for human papillomavirus (HPV): Secondary | ICD-10-CM

## 2018-02-11 DIAGNOSIS — Z3041 Encounter for surveillance of contraceptive pills: Secondary | ICD-10-CM

## 2018-02-11 DIAGNOSIS — Z124 Encounter for screening for malignant neoplasm of cervix: Secondary | ICD-10-CM | POA: Diagnosis not present

## 2018-02-11 DIAGNOSIS — Z30431 Encounter for routine checking of intrauterine contraceptive device: Secondary | ICD-10-CM

## 2018-02-11 DIAGNOSIS — N96 Recurrent pregnancy loss: Secondary | ICD-10-CM | POA: Insufficient documentation

## 2018-02-11 MED ORDER — MISOPROSTOL 100 MCG PO TABS: 100.0000 ug | ORAL_TABLET | Freq: Once | ORAL | 0 refills | Status: DC

## 2018-02-11 MED ORDER — NORETHINDRONE 0.35 MG PO TABS: 1.0000 | ORAL_TABLET | Freq: Every day | ORAL | 3 refills | Status: DC

## 2018-02-11 NOTE — Progress Notes (Signed)
PCP:  Patient, No Pcp Per   Chief Complaint  Patient presents with  . Gynecologic Exam     HPI:      [name] is a 38 y.o. [id number] who LMP was Patient's last menstrual period was 01/08/2018 (approximate)., presents today for her annual examination.  Her menses are regular every 28-30 days, a few days of light spotting only. Pt with Skyla and camila OCPs Was having 12-14 days of bleeding with Skyla only last yr, so added POPs with sx control. Had neg GYN u/s 11/18. Dysmenorrhea moderate, occurring premenstrually and during period. Pt takes aleve with sx relief.  She does not have intermenstrual bleeding. She has a hx of PCOS.Eileen Garcia placed due to anovulatory bleeding/menorrhagia with Dr. Laurey Morale. Suspicion of endometriosis given hx of pelvic pain. Failed mult OCPs, ortho evra and depo. Hx of migraines with aura.   Sex activity: single partner, contraception - IUD. Skyla placed 05/25/15. Pt with dyspareunia before orgasm, no change since last yr. She may want to conceive in near future. Hx of 4 miscarriages in past.  Last Pap: December 22, 2014  Results were: no abnormalities /neg HPV DNA  Hx of STDs: none  There is no FH of breast cancer. There is a FH of uterine vs ovarian cancer in PGM (no chemo/rad done), genetic testing not done. The patient does do self-breast exams.  Tobacco use: The patient denies current or previous tobacco use. Alcohol use: social drinker No drug use.  Exercise: moderately active  She does not get adequate calcium and Vitamin D in her diet.  Had kidney stone a few days ago. Never had one before. Went to ED.   Past Medical History:  Diagnosis Date  . Amenorrhea   . Anxiety   . Headache   . Kidney stone 02/2018  . Menorrhagia   . Migraine with aura   . Missed abortion   . Pelvic pain   . Polycystic ovaries     Past Surgical History:  Procedure Laterality Date  . WISDOM TOOTH EXTRACTION      Family History  Problem Relation Age of  Onset  . Hypertension Father   . Migraines Paternal Grandmother   . Diabetes Paternal Grandmother        type 1  . Hypertension Paternal Grandmother   . Uterine cancer Paternal Grandmother 33       vs ovar--no chemo/rad done    Social History   Socioeconomic History  . Marital status: Married    Spouse name: Not on file  . Number of children: Not on file  . Years of education: Not on file  . Highest education level: Not on file  Occupational History  . Not on file  Social Needs  . Financial resource strain: Not on file  . Food insecurity:    Worry: Not on file    Inability: Not on file  . Transportation needs:    Medical: Not on file    Non-medical: Not on file  Tobacco Use  . Smoking status: Never Smoker  . Smokeless tobacco: Never Used  Substance and Sexual Activity  . Alcohol use: Yes  . Drug use: No  . Sexual activity: Yes    Birth control/protection: I.U.D.  Lifestyle  . Physical activity:    Days per week: Not on file    Minutes per session: Not on file  . Stress: Not on file  Relationships  . Social connections:    Talks on phone:  Not on file    Gets together: Not on file    Attends religious service: Not on file    Active member of club or organization: Not on file    Attends meetings of clubs or organizations: Not on file    Relationship status: Not on file  . Intimate partner violence:    Fear of current or ex partner: Not on file    Emotionally abused: Not on file    Physically abused: Not on file    Forced sexual activity: Not on file  Other Topics Concern  . Not on file  Social History Narrative  . Not on file    Current Meds  Medication Sig  . albuterol (PROVENTIL HFA) 108 (90 Base) MCG/ACT inhaler Inhale 2 puffs into the lungs as needed.  Marland Kitchen EPINEPHrine (EPIPEN 2-PAK) 0.3 mg/0.3 mL IJ SOAJ injection Reported on 03/29/2015  . Levonorgestrel 13.5 MG IUD 13.5 mg by Intrauterine route.  . montelukast (SINGULAIR) 10 MG tablet Take by mouth.  .  norethindrone (MICRONOR,CAMILA,ERRIN) 0.35 MG tablet Take 1 tablet (0.35 mg total) by mouth daily.  . [DISCONTINUED] norethindrone (MICRONOR,CAMILA,ERRIN) 0.35 MG tablet Take 1 tablet (0.35 mg total) by mouth daily.     ROS:  Review of Systems  Constitutional: Negative for fatigue, fever and unexpected weight change.  Respiratory: Negative for cough, shortness of breath and wheezing.   Cardiovascular: Negative for chest pain, palpitations and leg swelling.  Gastrointestinal: Negative for blood in stool, constipation, diarrhea, nausea and vomiting.  Endocrine: Negative for cold intolerance, heat intolerance and polyuria.  Genitourinary: Positive for dyspareunia. Negative for dysuria, flank pain, frequency, genital sores, hematuria, menstrual problem, pelvic pain, urgency, vaginal bleeding, vaginal discharge and vaginal pain.  Musculoskeletal: Negative for back pain, joint swelling and myalgias.  Skin: Negative for rash.  Neurological: Positive for headaches. Negative for dizziness, syncope, light-headedness and numbness.  Hematological: Negative for adenopathy.  Psychiatric/Behavioral: Negative for agitation, confusion, sleep disturbance and suicidal ideas. The patient is not nervous/anxious.      Objective: BP 130/82   Ht 5\' 1"  (1.549 m)   Wt 200 lb (90.7 kg)   LMP 01/08/2018 (Approximate)   BMI 37.79 kg/m    Physical Exam Constitutional:      Appearance: She is well-developed.  Genitourinary:     Vulva and vagina normal.     No vulval lesion or tenderness noted.     No vaginal discharge, erythema, tenderness or bleeding.     No cervical motion tenderness, discharge, friability or polyp.     IUD strings visualized.     Uterus is tender.     Uterus is not enlarged.     No right or left adnexal mass present.     Right adnexa tender.     Left adnexa tender.  Neck:     Musculoskeletal: Normal range of motion.     Thyroid: No thyromegaly.  Cardiovascular:     Rate and  Rhythm: Normal rate and regular rhythm.     Heart sounds: Normal heart sounds. No murmur.  Pulmonary:     Effort: Pulmonary effort is normal.     Breath sounds: Normal breath sounds.  Chest:     Breasts:        Right: No mass, nipple discharge, skin change or tenderness.        Left: No mass, nipple discharge, skin change or tenderness.  Abdominal:     Palpations: Abdomen is soft.     Tenderness: There is  no abdominal tenderness. There is no guarding.  Musculoskeletal: Normal range of motion.  Neurological:     Mental Status: She is alert and oriented to person, place, and time.     Cranial Nerves: No cranial nerve deficit.  Psychiatric:        Behavior: Behavior normal.  Vitals signs and nursing note reviewed.     Assessment/Plan: Encounter for annual routine gynecological examination  Cervical cancer screening - Plan: Cytology - PAP  Screening for HPV (human papillomavirus) - Plan: Cytology - PAP  Encounter for routine checking of intrauterine contraceptive device (IUD) - IUD in place. Due for removal/replacement with [facility] 4/19. Pt to call to sched. Rx cytotec/NSAIDs.  - Plan: misoprostol (CYTOTEC) 100 MCG tablet  Encounter for birth control pills maintenance - Camila RF eRxd.  - Plan: norethindrone (MICRONOR,CAMILA,ERRIN) 0.35 MG tablet  History of multiple miscarriages - Pt may want to conceive. F/u with MD as soon as pregnant.      Meds ordered this encounter  Medications  . norethindrone (MICRONOR,CAMILA,ERRIN) 0.35 MG tablet    Sig: Take 1 tablet (0.35 mg total) by mouth daily.    Dispense:  84 tablet    Refill:  3    Order Specific Question:   Supervising Provider    Answer:   Gae Dry [id number]  . misoprostol (CYTOTEC) 100 MCG tablet    Sig: Take 1 tablet (100 mcg total) by mouth once for 1 dose. 1 hour before appt    Dispense:  1 tablet    Refill:  0    Order Specific Question:   Supervising Provider    Answer:   Gae Dry [[id number]]            GYN counsel adequate intake of calcium and vitamin D, diet and exercise     F/U  Return in about 1 year (around 02/12/2019).  Alicia B. Copland, PA-C 02/11/2018 9:33 AM

## 2018-02-11 NOTE — Patient Instructions (Signed)
I value your feedback and entrusting us with your care. If you get a [place] patient survey, I would appreciate you taking the time to let us know about your experience today. Thank you! 

## 2018-02-12 LAB — CYTOLOGY - PAP
Diagnosis: NEGATIVE
HPV: NOT DETECTED

## 2018-02-19 DIAGNOSIS — M542 Cervicalgia: Secondary | ICD-10-CM | POA: Diagnosis not present

## 2018-02-19 DIAGNOSIS — G43719 Chronic migraine without aura, intractable, without status migrainosus: Secondary | ICD-10-CM | POA: Diagnosis not present

## 2018-02-19 DIAGNOSIS — G518 Other disorders of facial nerve: Secondary | ICD-10-CM | POA: Diagnosis not present

## 2018-02-19 DIAGNOSIS — M791 Myalgia, unspecified site: Secondary | ICD-10-CM | POA: Diagnosis not present

## 2018-02-19 DIAGNOSIS — G43839 Menstrual migraine, intractable, without status migrainosus: Secondary | ICD-10-CM | POA: Diagnosis not present

## 2018-03-04 DIAGNOSIS — L2089 Other atopic dermatitis: Secondary | ICD-10-CM | POA: Diagnosis not present

## 2018-03-04 DIAGNOSIS — L308 Other specified dermatitis: Secondary | ICD-10-CM | POA: Diagnosis not present

## 2018-03-07 DIAGNOSIS — Z6836 Body mass index (BMI) 36.0-36.9, adult: Secondary | ICD-10-CM | POA: Diagnosis not present

## 2018-03-07 DIAGNOSIS — N200 Calculus of kidney: Secondary | ICD-10-CM | POA: Diagnosis not present

## 2018-03-28 DIAGNOSIS — M791 Myalgia, unspecified site: Secondary | ICD-10-CM | POA: Diagnosis not present

## 2018-03-28 DIAGNOSIS — G43719 Chronic migraine without aura, intractable, without status migrainosus: Secondary | ICD-10-CM | POA: Diagnosis not present

## 2018-03-28 DIAGNOSIS — G518 Other disorders of facial nerve: Secondary | ICD-10-CM | POA: Diagnosis not present

## 2018-03-28 DIAGNOSIS — M542 Cervicalgia: Secondary | ICD-10-CM | POA: Diagnosis not present

## 2018-03-28 DIAGNOSIS — G43839 Menstrual migraine, intractable, without status migrainosus: Secondary | ICD-10-CM | POA: Diagnosis not present

## 2018-04-07 DIAGNOSIS — Z87442 Personal history of urinary calculi: Secondary | ICD-10-CM

## 2018-04-07 HISTORY — DX: Personal history of urinary calculi: Z87.442

## 2018-05-01 DIAGNOSIS — M7662 Achilles tendinitis, left leg: Secondary | ICD-10-CM | POA: Diagnosis not present

## 2018-05-06 DIAGNOSIS — M79672 Pain in left foot: Secondary | ICD-10-CM | POA: Diagnosis not present

## 2018-05-06 DIAGNOSIS — M7662 Achilles tendinitis, left leg: Secondary | ICD-10-CM | POA: Diagnosis not present

## 2018-05-06 DIAGNOSIS — M7732 Calcaneal spur, left foot: Secondary | ICD-10-CM | POA: Diagnosis not present

## 2018-05-09 DIAGNOSIS — G43839 Menstrual migraine, intractable, without status migrainosus: Secondary | ICD-10-CM | POA: Diagnosis not present

## 2018-05-09 DIAGNOSIS — G43719 Chronic migraine without aura, intractable, without status migrainosus: Secondary | ICD-10-CM | POA: Diagnosis not present

## 2018-05-09 DIAGNOSIS — M791 Myalgia, unspecified site: Secondary | ICD-10-CM | POA: Diagnosis not present

## 2018-05-09 DIAGNOSIS — G518 Other disorders of facial nerve: Secondary | ICD-10-CM | POA: Diagnosis not present

## 2018-05-09 DIAGNOSIS — M542 Cervicalgia: Secondary | ICD-10-CM | POA: Diagnosis not present

## 2018-06-03 DIAGNOSIS — M7662 Achilles tendinitis, left leg: Secondary | ICD-10-CM | POA: Diagnosis not present

## 2018-06-03 DIAGNOSIS — M7732 Calcaneal spur, left foot: Secondary | ICD-10-CM | POA: Diagnosis not present

## 2018-06-24 DIAGNOSIS — M7662 Achilles tendinitis, left leg: Secondary | ICD-10-CM | POA: Diagnosis not present

## 2018-06-24 DIAGNOSIS — M7732 Calcaneal spur, left foot: Secondary | ICD-10-CM | POA: Diagnosis not present

## 2018-07-03 ENCOUNTER — Telehealth: Payer: Self-pay

## 2018-07-03 NOTE — Telephone Encounter (Signed)
Please advise 

## 2018-07-03 NOTE — Telephone Encounter (Signed)
Pt calling; was supposed to have Skyla changed out in 05/2018; put it off d/t Covid19.  Was told to call c period.  Has not had a period.  Is it safe (as far as being active contraception) to be 24m out?  Should she come in for procedure soon or what to do?  [phone]

## 2018-07-05 ENCOUNTER — Telehealth: Payer: Self-pay | Admitting: Obstetrics and Gynecology

## 2018-07-05 NOTE — Telephone Encounter (Signed)
Have pt take UPT. If neg, reassurance. If she is still on camila, then no need to worry about skyla. If not on camila anymore, then use condoms until IUD replaced. Pt can RTO for IUD replacement either with menses or I can send in cytotec Rx to take 1 hr before appt if no menses.  Pls find out pt preference. Thx.

## 2018-07-05 NOTE — Telephone Encounter (Signed)
Patient is schedule for skyla replacement with [facility] on 07/08/18

## 2018-07-05 NOTE — Telephone Encounter (Signed)
Called pt, no answer, LVMTRC. She is already on schedule for Monday to have Skyla replaced.

## 2018-07-08 ENCOUNTER — Encounter: Payer: Self-pay | Admitting: Obstetrics and Gynecology

## 2018-07-08 ENCOUNTER — Ambulatory Visit (INDEPENDENT_AMBULATORY_CARE_PROVIDER_SITE_OTHER): Payer: BLUE CROSS/BLUE SHIELD | Admitting: Obstetrics and Gynecology

## 2018-07-08 ENCOUNTER — Other Ambulatory Visit: Payer: Self-pay

## 2018-07-08 VITALS — BP 114/80 | Ht 61.0 in | Wt 196.8 lb

## 2018-07-08 DIAGNOSIS — Z30433 Encounter for removal and reinsertion of intrauterine contraceptive device: Secondary | ICD-10-CM

## 2018-07-08 MED ORDER — LEVONORGESTREL 19.5 MG IU IUD: 19.5000 mg | INTRAUTERINE_SYSTEM | Freq: Once | INTRAUTERINE | 0 refills | Status: DC

## 2018-07-08 NOTE — Patient Instructions (Addendum)
I value your feedback and entrusting us with your care. If you get a [facility] patient survey, I would appreciate you taking the time to let us know about your experience today. Thank you!  [facility] [phone]  Instructions after IUD insertion  Most women experience no significant problems after insertion of an IUD, however minor cramping and spotting for a few days is common. Cramps may be treated with ibuprofen 800mg every 8 hours or Tylenol 650 mg every 4 hours. Contact [facility] immediately if you experience any of the following symptoms during the next week: temperature >99.6 degrees, worsening pelvic pain, abdominal pain, fainting, unusually heavy vaginal bleeding, foul vaginal discharge, or if you think you have expelled the IUD.  Nothing inserted in the vagina for 48 hours. You will be scheduled for a follow up visit in approximately four weeks.  You should check monthly to be sure you can feel the IUD strings in the upper vagina. If you are having a monthly period, try to check after each period. If you cannot feel the IUD strings,  contact [facility] immediately so we can do an exam to determine if the IUD has been expelled.   Please use backup protection until we can confirm the IUD is in place.  Call [facility] if you are exposed to or diagnosed with a sexually transmitted infection, as we will need to discuss whether it is safe for you to continue using an IUD.   

## 2018-07-08 NOTE — Progress Notes (Signed)
   Chief Complaint  Patient presents with  . Contraception    IUD removal/reinsertion     History of Present Illness:  Trulee A Kuznicki is a 38 y.o. that had a Skyla IUD placed approximately 3 years ago for menorrhagia, suspicion of endometriosis, by Dr. Laurey Morale. Failed mult OCPs, patch and depo. Takes camila POPs in addition to IUD to help with irregular bleeding, controls sx fairly well. Hx of migraines with aura. Would like replacement IUD. IUDs are very painful for pt and prefers smaller one.  BP 114/80   Ht 5\' 1"  (1.549 m)   Wt 196 lb 12.8 oz (89.3 kg)   LMP 07/04/2018 (Exact Date)   BMI 37.19 kg/m   Pelvic exam:  Two IUD strings present seen coming from the cervical os. EGBUS, vaginal vault and cervix: within normal limits  IUD Removal Strings of IUD identified and grasped.  IUD removed without problem with ring forceps.  Pt tolerated this well.  IUD noted to be intact.  IUD Insertion Procedure Note Patient identified, informed consent performed, consent signed.   Discussed risks of irregular bleeding, cramping, infection, malpositioning or misplacement of the IUD outside the uterus which may require further procedure such as laparoscopy, risk of failure <1%. Time out was performed.    Speculum placed in the vagina.  Cervix visualized.  Cleaned with Betadine x 2.  Grasped anteriorly with a single tooth tenaculum.  Uterus sounded to 7.0 cm.   IUD placed per manufacturer's recommendations.  Strings trimmed to 3 cm. Tenaculum was removed, good hemostasis noted.  Patient tolerated procedure well.   ASSESSMENT:  Encounter for removal and reinsertion of intrauterine contraceptive device (IUD) - Plan: Levonorgestrel (KYLEENA) 19.5 MG IUD   Meds ordered this encounter  Medications  . Levonorgestrel (KYLEENA) 19.5 MG IUD    Sig: 1 each (19.5 mg total) by Intrauterine route once for 1 dose.    Dispense:  1 each    Refill:  0    Order Specific Question:   Supervising Provider   Answer:   Gae Dry [[id number]]     Plan:  Patient was given post-procedure instructions.  She was advised to have backup contraception for one week.   Call if you are having increasing pain, cramps or bleeding or if you have a fever greater than 100.4 degrees F., shaking chills, nausea or vomiting. Patient was also asked to check IUD strings periodically and follow up in 4 weeks for IUD check.  Return in about 4 weeks (around 08/05/2018) for IUD f/u.  Winfrey Chillemi B. Tywone Bembenek, PA-C 07/08/2018 3:59 PM

## 2018-07-10 DIAGNOSIS — M7662 Achilles tendinitis, left leg: Secondary | ICD-10-CM | POA: Diagnosis not present

## 2018-07-10 DIAGNOSIS — M7732 Calcaneal spur, left foot: Secondary | ICD-10-CM | POA: Diagnosis not present

## 2018-07-10 NOTE — Telephone Encounter (Signed)
Pt rcvd/charged Kyleena, Removal/Insertion 07/08/2018

## 2018-07-11 ENCOUNTER — Other Ambulatory Visit: Payer: Self-pay | Admitting: Podiatry

## 2018-07-23 ENCOUNTER — Encounter
Admission: RE | Admit: 2018-07-23 | Discharge: 2018-07-23 | Disposition: A | Discharge status: Home / Self Care | Payer: BC Managed Care – PPO | Source: Ambulatory Visit | Attending: Podiatry | Admitting: Podiatry

## 2018-07-23 ENCOUNTER — Other Ambulatory Visit: Payer: Self-pay

## 2018-07-23 DIAGNOSIS — Z1159 Encounter for screening for other viral diseases: Secondary | ICD-10-CM | POA: Insufficient documentation

## 2018-07-23 DIAGNOSIS — M545 Low back pain, unspecified: Secondary | ICD-10-CM

## 2018-07-23 DIAGNOSIS — Z01812 Encounter for preprocedural laboratory examination: Secondary | ICD-10-CM | POA: Insufficient documentation

## 2018-07-23 HISTORY — DX: Other complications of anesthesia, initial encounter: T88.59XA

## 2018-07-23 HISTORY — DX: Family history of other specified conditions: Z84.89

## 2018-07-23 HISTORY — DX: Other specified postprocedural states: Z98.890

## 2018-07-23 HISTORY — DX: Low back pain, unspecified: M54.50

## 2018-07-23 HISTORY — DX: Unspecified asthma, uncomplicated: J45.909

## 2018-07-23 HISTORY — DX: Gastro-esophageal reflux disease without esophagitis: K21.9

## 2018-07-23 NOTE — Patient Instructions (Addendum)
Your procedure is scheduled on: 07-26-18 FRIDAY Report to Same Day Surgery 2nd floor medical mall [facility] Entrance-take elevator on left to 2nd floor.  Check in with surgery information desk.) To find out your arrival time please call [phone] between 1PM - 3PM on 07-25-18 THURSDAY  Remember: Instructions that are not followed completely may result in serious medical risk, up to and including death, or upon the discretion of your surgeon and anesthesiologist your surgery may need to be rescheduled.    _x___ 1. Do not eat food after midnight the night before your procedure. NO GUM OR CANDY AFTER MIDNIGHT. You may drink clear liquids up to 2 hours before you are scheduled to arrive at the hospital for your procedure.  Do not drink clear liquids within 2 hours of your scheduled arrival to the hospital.  Clear liquids include  --Water or Apple juice without pulp  --Clear carbohydrate beverage such as ClearFast or Gatorade  --Black Coffee or Clear Tea (No milk, no creamers, do not add anything to the coffee or Tea   ____Ensure clear carbohydrate drink on the way to the hospital for bariatric patients  _X___Ensure clear carbohydrate drink 3 hours before surgery      __x__ 2. No Alcohol for 24 hours before or after surgery.   __x__3. No Smoking or e-cigarettes for 24 prior to surgery.  Do not use any chewable tobacco products for at least 6 hour prior to surgery   ____  4. Bring all medications with you on the day of surgery if instructed.    __x__ 5. Notify your doctor if there is any change in your medical condition     (cold, fever, infections).    x___6. On the morning of surgery brush your teeth with toothpaste and water.  You may rinse your mouth with mouth wash if you wish.  Do not swallow any toothpaste or mouthwash.   Do not wear jewelry, make-up, hairpins, clips or nail polish.  Do not wear lotions, powders, or perfumes. You may wear deodorant.  Do not shave 48 hours  prior to surgery. Men may shave face and neck.  Do not bring valuables to the hospital.    [facility] is not responsible for any belongings or valuables.               Contacts, dentures or bridgework may not be worn into surgery.  Leave your suitcase in the car. After surgery it may be brought to your room.  For patients admitted to the hospital, discharge time is determined by your treatment team.  _  Patients discharged the day of surgery will not be allowed to drive home.  You will need someone to drive you home and stay with you the night of your procedure.    Please read over the following fact sheets that you were given:   [facility] Preparing for Surgery  ____ Take anti-hypertensive listed below, cardiac, seizure, asthma, anti-reflux and psychiatric medicines. These include:  1. NONE  2.  3.  4.  5.  6.  ____Fleets enema or Magnesium Citrate as directed.   _x___ Use CHG Soap or sage wipes as directed on instruction sheet   _X___ Use inhalers on the day of surgery and bring to hospital day of surgery-BRING YOUR ALBUTEROL Bent  ____ Stop Metformin and Janumet 2 days prior to surgery.    ____ Take 1/2 of usual insulin dose the night before surgery and none on the morning  surgery.   ____ Follow recommendations from Cardiologist, Pulmonologist or PCP regarding stopping Aspirin, Coumadin, Plavix ,Eliquis, Effient, or Pradaxa, and Pletal.  X____Stop Anti-inflammatories such as Advil, Aleve, Ibuprofen, Motrin, Naproxen, Naprosyn, Goodies powders or aspirin products NOW- OK to take Tylenol    ____ Stop supplements until after surgery.    ____ Bring C-Pap to the hospital.

## 2018-07-24 LAB — NOVEL CORONAVIRUS, NAA (HOSP ORDER, SEND-OUT TO REF LAB; TAT 18-24 HRS): SARS-CoV-2, NAA: NOT DETECTED

## 2018-07-26 ENCOUNTER — Other Ambulatory Visit: Payer: Self-pay

## 2018-07-26 ENCOUNTER — Encounter: Payer: Self-pay | Admitting: Emergency Medicine

## 2018-07-26 ENCOUNTER — Encounter
Admission: RE | Disposition: A | Discharge status: Home / Self Care | Payer: Self-pay | Source: Ambulatory Visit | Attending: Podiatry

## 2018-07-26 ENCOUNTER — Ambulatory Visit: Payer: BC Managed Care – PPO | Admitting: Anesthesiology

## 2018-07-26 ENCOUNTER — Ambulatory Visit
Admission: RE | Admit: 2018-07-26 | Discharge: 2018-07-26 | Disposition: A | Discharge status: Home / Self Care | Payer: BC Managed Care – PPO | Source: Ambulatory Visit | Attending: Podiatry | Admitting: Podiatry

## 2018-07-26 DIAGNOSIS — Z975 Presence of (intrauterine) contraceptive device: Secondary | ICD-10-CM | POA: Insufficient documentation

## 2018-07-26 DIAGNOSIS — M7662 Achilles tendinitis, left leg: Secondary | ICD-10-CM | POA: Insufficient documentation

## 2018-07-26 DIAGNOSIS — J45909 Unspecified asthma, uncomplicated: Secondary | ICD-10-CM | POA: Insufficient documentation

## 2018-07-26 DIAGNOSIS — M7732 Calcaneal spur, left foot: Secondary | ICD-10-CM | POA: Insufficient documentation

## 2018-07-26 DIAGNOSIS — M898X7 Other specified disorders of bone, ankle and foot: Secondary | ICD-10-CM | POA: Diagnosis not present

## 2018-07-26 DIAGNOSIS — Z87891 Personal history of nicotine dependence: Secondary | ICD-10-CM | POA: Diagnosis not present

## 2018-07-26 HISTORY — PX: ACHILLES TENDON SURGERY: SHX542

## 2018-07-26 LAB — POCT PREGNANCY, URINE: Preg Test, Ur: NEGATIVE

## 2018-07-26 SURGERY — REPAIR, TENDON, ACHILLES
Anesthesia: General | Laterality: Left

## 2018-07-26 MED ORDER — POVIDONE-IODINE 7.5 % EX SOLN
Freq: Once | CUTANEOUS | Status: DC
  Filled 2018-07-26: qty 118

## 2018-07-26 MED ORDER — BUPIVACAINE-EPINEPHRINE (PF) 0.25% -1:200000 IJ SOLN
INTRAMUSCULAR | Status: DC | PRN
  Administered 2018-07-26: 8 mL

## 2018-07-26 MED ORDER — SUGAMMADEX SODIUM 200 MG/2ML IV SOLN
INTRAVENOUS | Status: DC | PRN
  Administered 2018-07-26: 200 mg via INTRAVENOUS

## 2018-07-26 MED ORDER — PROMETHAZINE HCL 25 MG/ML IJ SOLN
INTRAMUSCULAR | Status: AC
  Filled 2018-07-26: qty 1

## 2018-07-26 MED ORDER — BUPIVACAINE-EPINEPHRINE (PF) 0.25% -1:200000 IJ SOLN
INTRAMUSCULAR | Status: AC
  Filled 2018-07-26: qty 30

## 2018-07-26 MED ORDER — SUCCINYLCHOLINE CHLORIDE 20 MG/ML IJ SOLN
INTRAMUSCULAR | Status: AC
  Filled 2018-07-26: qty 1

## 2018-07-26 MED ORDER — BUPIVACAINE LIPOSOME 1.3 % IJ SUSP
INTRAMUSCULAR | Status: DC | PRN
  Administered 2018-07-26: 8 mL
  Administered 2018-07-26: 10 mL

## 2018-07-26 MED ORDER — BUPIVACAINE LIPOSOME 1.3 % IJ SUSP
INTRAMUSCULAR | Status: AC
  Filled 2018-07-26: qty 20

## 2018-07-26 MED ORDER — ROCURONIUM BROMIDE 50 MG/5ML IV SOLN
INTRAVENOUS | Status: AC
  Filled 2018-07-26: qty 1

## 2018-07-26 MED ORDER — PHENYLEPHRINE HCL (PRESSORS) 10 MG/ML IV SOLN
INTRAVENOUS | Status: DC | PRN
  Administered 2018-07-26 (×8): 100 ug via INTRAVENOUS
  Administered 2018-07-26: 200 ug via INTRAVENOUS

## 2018-07-26 MED ORDER — CLINDAMYCIN PHOSPHATE 900 MG/50ML IV SOLN
900.0000 mg | INTRAVENOUS | Status: AC
  Administered 2018-07-26: 900 mg via INTRAVENOUS

## 2018-07-26 MED ORDER — SODIUM CHLORIDE FLUSH 0.9 % IV SOLN
INTRAVENOUS | Status: AC
  Filled 2018-07-26: qty 10

## 2018-07-26 MED ORDER — DEXMEDETOMIDINE HCL 200 MCG/2ML IV SOLN
INTRAVENOUS | Status: DC | PRN
  Administered 2018-07-26: 12 ug via INTRAVENOUS

## 2018-07-26 MED ORDER — LACTATED RINGERS IV SOLN
INTRAVENOUS | Status: DC
  Administered 2018-07-26: 07:00:00 via INTRAVENOUS

## 2018-07-26 MED ORDER — ROCURONIUM BROMIDE 100 MG/10ML IV SOLN
INTRAVENOUS | Status: DC | PRN
  Administered 2018-07-26: 50 mg via INTRAVENOUS

## 2018-07-26 MED ORDER — ONDANSETRON HCL 4 MG/2ML IJ SOLN
4.0000 mg | Freq: Once | INTRAMUSCULAR | Status: AC | PRN
  Administered 2018-07-26: 4 mg via INTRAVENOUS

## 2018-07-26 MED ORDER — BUPIVACAINE HCL (PF) 0.25 % IJ SOLN
INTRAMUSCULAR | Status: AC
  Filled 2018-07-26: qty 30

## 2018-07-26 MED ORDER — DEXMEDETOMIDINE HCL IN NACL 200 MCG/50ML IV SOLN
INTRAVENOUS | Status: AC
  Filled 2018-07-26: qty 50

## 2018-07-26 MED ORDER — ACETAMINOPHEN 10 MG/ML IV SOLN
INTRAVENOUS | Status: DC | PRN
  Administered 2018-07-26: 1000 mg via INTRAVENOUS

## 2018-07-26 MED ORDER — ONDANSETRON HCL 4 MG PO TABS: 4.0000 mg | ORAL_TABLET | Freq: Four times a day (QID) | ORAL | Status: DC | PRN

## 2018-07-26 MED ORDER — FAMOTIDINE 20 MG PO TABS
ORAL_TABLET | ORAL | Status: AC
  Filled 2018-07-26: qty 1

## 2018-07-26 MED ORDER — FENTANYL CITRATE (PF) 100 MCG/2ML IJ SOLN
25.0000 ug | INTRAMUSCULAR | Status: DC | PRN
  Administered 2018-07-26 (×4): 25 ug via INTRAVENOUS

## 2018-07-26 MED ORDER — DEXAMETHASONE SODIUM PHOSPHATE 10 MG/ML IJ SOLN
INTRAMUSCULAR | Status: DC | PRN
  Administered 2018-07-26: 5 mg via INTRAVENOUS

## 2018-07-26 MED ORDER — FENTANYL CITRATE (PF) 100 MCG/2ML IJ SOLN
INTRAMUSCULAR | Status: AC
  Filled 2018-07-26: qty 2

## 2018-07-26 MED ORDER — ONDANSETRON HCL 4 MG/2ML IJ SOLN: 4.0000 mg | Freq: Four times a day (QID) | INTRAMUSCULAR | Status: DC | PRN

## 2018-07-26 MED ORDER — LIDOCAINE HCL (CARDIAC) PF 100 MG/5ML IV SOSY
PREFILLED_SYRINGE | INTRAVENOUS | Status: DC | PRN
  Administered 2018-07-26: 100 mg via INTRAVENOUS

## 2018-07-26 MED ORDER — MIDAZOLAM HCL 2 MG/2ML IJ SOLN
INTRAMUSCULAR | Status: AC
  Filled 2018-07-26: qty 2

## 2018-07-26 MED ORDER — MIDAZOLAM HCL 2 MG/2ML IJ SOLN
INTRAMUSCULAR | Status: DC | PRN
  Administered 2018-07-26: 2 mg via INTRAVENOUS

## 2018-07-26 MED ORDER — ONDANSETRON HCL 4 MG/2ML IJ SOLN
INTRAMUSCULAR | Status: AC
  Filled 2018-07-26: qty 2

## 2018-07-26 MED ORDER — ONDANSETRON HCL 4 MG/2ML IJ SOLN
INTRAMUSCULAR | Status: DC | PRN
  Administered 2018-07-26: 4 mg via INTRAVENOUS

## 2018-07-26 MED ORDER — PROPOFOL 10 MG/ML IV BOLUS
INTRAVENOUS | Status: AC
  Filled 2018-07-26: qty 20

## 2018-07-26 MED ORDER — DEXAMETHASONE SODIUM PHOSPHATE 10 MG/ML IJ SOLN
INTRAMUSCULAR | Status: AC
  Filled 2018-07-26: qty 1

## 2018-07-26 MED ORDER — LIDOCAINE HCL (PF) 2 % IJ SOLN
INTRAMUSCULAR | Status: AC
  Filled 2018-07-26: qty 10

## 2018-07-26 MED ORDER — FAMOTIDINE 20 MG PO TABS
20.0000 mg | ORAL_TABLET | Freq: Once | ORAL | Status: AC
  Administered 2018-07-26: 07:00:00 20 mg via ORAL

## 2018-07-26 MED ORDER — OXYCODONE-ACETAMINOPHEN 5-325 MG PO TABS: 1.0000 | ORAL_TABLET | Freq: Four times a day (QID) | ORAL | 0 refills | Status: AC | PRN

## 2018-07-26 MED ORDER — ACETAMINOPHEN 10 MG/ML IV SOLN
INTRAVENOUS | Status: AC
  Filled 2018-07-26: qty 100

## 2018-07-26 MED ORDER — PROPOFOL 10 MG/ML IV BOLUS
INTRAVENOUS | Status: DC | PRN
  Administered 2018-07-26: 150 mg via INTRAVENOUS

## 2018-07-26 MED ORDER — CLINDAMYCIN PHOSPHATE 900 MG/50ML IV SOLN
INTRAVENOUS | Status: AC
  Filled 2018-07-26: qty 50

## 2018-07-26 MED ORDER — BUPIVACAINE HCL (PF) 0.5 % IJ SOLN
INTRAMUSCULAR | Status: AC
  Filled 2018-07-26: qty 30

## 2018-07-26 MED ORDER — FENTANYL CITRATE (PF) 100 MCG/2ML IJ SOLN
INTRAMUSCULAR | Status: DC | PRN
  Administered 2018-07-26: 50 ug via INTRAVENOUS
  Administered 2018-07-26 (×2): 25 ug via INTRAVENOUS

## 2018-07-26 MED ORDER — PROMETHAZINE HCL 25 MG/ML IJ SOLN
6.2500 mg | Freq: Four times a day (QID) | INTRAMUSCULAR | Status: DC | PRN
  Administered 2018-07-26: 6.25 mg via INTRAVENOUS

## 2018-07-26 MED ORDER — LIDOCAINE HCL (PF) 1 % IJ SOLN
INTRAMUSCULAR | Status: AC
  Filled 2018-07-26: qty 30

## 2018-07-26 SURGICAL SUPPLY — 56 items
ANCHOR 4.5 FOOTPRINT ULTRA (Anchor) ×1 IMPLANT
BANDAGE ELASTIC 4 LF NS (GAUZE/BANDAGES/DRESSINGS) ×4 IMPLANT
BIT DRILL 4X4.5 FOOTPRINT STR (BIT) IMPLANT
BLADE SURG 15 STRL LF DISP TIS (BLADE) ×2 IMPLANT
BLADE SURG 15 STRL SS (BLADE) ×3
BLADE SURG MINI STRL (BLADE) ×1 IMPLANT
BNDG COHESIVE 4X5 TAN STRL (GAUZE/BANDAGES/DRESSINGS) ×1 IMPLANT
BNDG CONFORM 2 STRL LF (GAUZE/BANDAGES/DRESSINGS) ×2 IMPLANT
BNDG CONFORM 3 STRL LF (GAUZE/BANDAGES/DRESSINGS) ×2 IMPLANT
BNDG ESMARK 4X12 TAN STRL LF (GAUZE/BANDAGES/DRESSINGS) ×2 IMPLANT
BNDG ESMARK 6X12 TAN STRL LF (GAUZE/BANDAGES/DRESSINGS) ×1 IMPLANT
CANISTER SUCT 1200ML W/VALVE (MISCELLANEOUS) ×2 IMPLANT
COVER WAND RF STERILE (DRAPES) ×2 IMPLANT
DRAPE FLUOR MINI C-ARM 54X84 (DRAPES) ×2 IMPLANT
DRILL 4X4.5 FOOTPRINT STR (BIT) ×2
DURAPREP 26ML APPLICATOR (WOUND CARE) ×2 IMPLANT
ELECT REM PT RETURN 9FT ADLT (ELECTROSURGICAL) ×2
ELECTRODE REM PT RTRN 9FT ADLT (ELECTROSURGICAL) ×1 IMPLANT
GAUZE SPONGE 4X4 12PLY STRL (GAUZE/BANDAGES/DRESSINGS) ×2 IMPLANT
GAUZE XEROFORM 1X8 LF (GAUZE/BANDAGES/DRESSINGS) ×2 IMPLANT
GLOVE BIO SURGEON STRL SZ7.5 (GLOVE) ×2 IMPLANT
GLOVE INDICATOR 8.0 STRL GRN (GLOVE) ×2 IMPLANT
GOWN STRL REUS W/ TWL LRG LVL3 (GOWN DISPOSABLE) ×2 IMPLANT
GOWN STRL REUS W/TWL LRG LVL3 (GOWN DISPOSABLE) ×2
HANDLE YANKAUER SUCT BULB TIP (MISCELLANEOUS) ×1 IMPLANT
KIT TURNOVER KIT A (KITS) ×2 IMPLANT
LABEL OR SOLS (LABEL) ×2 IMPLANT
NDL HYPO 25X1 1.5 SAFETY (NEEDLE) ×3 IMPLANT
NDL MAYO CATGUT SZ5 (NEEDLE) ×1
NDL SUT 5 .5 CRC TPR PNT MAYO (NEEDLE) ×1 IMPLANT
NEEDLE HYPO 25X1 1.5 SAFETY (NEEDLE) ×6 IMPLANT
NS IRRIG 500ML POUR BTL (IV SOLUTION) ×2 IMPLANT
PACK EXTREMITY ARMC (MISCELLANEOUS) ×2 IMPLANT
PAD CAST CTTN 4X4 STRL (SOFTGOODS) ×1 IMPLANT
PADDING CAST COTTON 4X4 STRL (SOFTGOODS) ×1
RASP SM TEAR CROSS CUT (RASP) ×2 IMPLANT
SPLINT CAST 1 STEP 5X30 WHT (MISCELLANEOUS) ×1 IMPLANT
SPLINT FAST PLASTER 5X30 (CAST SUPPLIES)
SPLINT PLASTER CAST FAST 5X30 (CAST SUPPLIES) ×1 IMPLANT
SPONGE LAP 18X18 RF (DISPOSABLE) ×2 IMPLANT
STOCKINETTE M/LG 89821 (MISCELLANEOUS) ×2 IMPLANT
STRIP CLOSURE SKIN 1/2X4 (GAUZE/BANDAGES/DRESSINGS) ×1 IMPLANT
SUT MNCRL+ 5-0 VIOLET P-3 (SUTURE) ×1 IMPLANT
SUT MONOCRYL 5-0 (SUTURE) ×1
SUT PDS AB 0 CT1 27 (SUTURE) IMPLANT
SUT VIC AB 0 SH 27 (SUTURE) ×2 IMPLANT
SUT VIC AB 2-0 SH 27 (SUTURE)
SUT VIC AB 2-0 SH 27XBRD (SUTURE) ×2 IMPLANT
SUT VIC AB 3-0 SH 27 (SUTURE) ×3
SUT VIC AB 3-0 SH 27X BRD (SUTURE) ×1 IMPLANT
SUT VIC AB 4-0 FS2 27 (SUTURE) ×2 IMPLANT
SUT VICRYL AB 3-0 FS1 BRD 27IN (SUTURE) ×1 IMPLANT
SWABSTK COMLB BENZOIN TINCTURE (MISCELLANEOUS) ×2 IMPLANT
SYR 10ML LL (SYRINGE) ×4 IMPLANT
SYR 3ML LL SCALE MARK (SYRINGE) ×2 IMPLANT
WIRE MAGNUM (SUTURE) ×2 IMPLANT

## 2018-07-26 NOTE — Discharge Instructions (Addendum)
[place] [place]   1. Take your medication as prescribed.  Pain medication should be taken only as needed.  2. Keep the dressing clean, dry and intact.  3. Keep your foot elevated above the heart level for the first 48 hours.  4. Walking to the bathroom and brief periods of walking are acceptable, unless we have instructed you to be non-weight bearing.  5. Always wear your post-op shoe when walking.  Always use your crutches if you are to be non-weight bearing.  6. Do not take a shower. Baths are permissible as long as the foot is kept out of the water.   7. Every hour you are awake:  - Bend your knee 15 times. - Flex foot 15 times - Massage calf 15 times  8. Call [facility] [phone]) if any of the following problems occur: - You develop a temperature or fever. - The bandage becomes saturated with blood. - Medication does not stop your pain. - Injury of the foot occurs. - Any symptoms of infection including redness, odor, or red streaks running from wound.  AMBULATORY SURGERY  DISCHARGE INSTRUCTIONS   1) The drugs that you were given will stay in your system until tomorrow so for the next 24 hours you should not:  A) Drive an automobile B) Make any legal decisions C) Drink any alcoholic beverage   2) You may resume regular meals tomorrow.  Today it is better to start with liquids and gradually work up to solid foods.  You may eat anything you prefer, but it is better to start with liquids, then soup and crackers, and gradually work up to solid foods.   3) Please notify your doctor immediately if you have any unusual bleeding, trouble breathing, redness and pain at the surgery site, drainage, fever, or pain not relieved by medication.    4) Additional Instructions:   Non weight bearing to your operative extremity.   Elevate  operative extremity at rest.  Do not remove postop boot per Dr. Vickki Muff.        Please contact your physician with any problems or Same Day Surgery at [phone], Monday through Friday 6 am to 4 pm, or [place] [facility] number at [phone].

## 2018-07-26 NOTE — Transfer of Care (Signed)
Immediate Anesthesia Transfer of Care Note  Patient: Eileen Garcia  Procedure(s) Performed: ACHILLES TENDON REPAIR SECONDARY (Left )  Patient Location: PACU  Anesthesia Type:General  Level of Consciousness: sedated  Airway & Oxygen Therapy: Patient Spontanous Breathing and Patient connected to face mask oxygen  Post-op Assessment: Report given to RN and Post -op Vital signs reviewed and stable  Post vital signs: Reviewed and stable  Last Vitals:  Vitals Value Taken Time  BP 106/75 07/26/18 0928  Temp    Pulse 74 07/26/18 0931  Resp 18 07/26/18 0931  SpO2 98 % 07/26/18 0931  Vitals shown include unvalidated device data.  Last Pain:  Vitals:   07/26/18 0626  TempSrc: Tympanic  PainSc: 5          Complications: No apparent anesthesia complications

## 2018-07-26 NOTE — Anesthesia Preprocedure Evaluation (Signed)
Anesthesia Evaluation  Patient identified by MRN, date of birth, ID band Patient awake    Reviewed: Allergy & Precautions, NPO status , Patient's Chart, lab work & pertinent test results  History of Anesthesia Complications (+) PONV  Airway Mallampati: III       Dental   Pulmonary asthma , former smoker,    Pulmonary exam normal        Cardiovascular negative cardio ROS Normal cardiovascular exam     Neuro/Psych  Headaches, Anxiety    GI/Hepatic Neg liver ROS, GERD  ,  Endo/Other  negative endocrine ROS  Renal/GU negative Renal ROS  negative genitourinary   Musculoskeletal   Abdominal Normal abdominal exam  (+)   Peds negative pediatric ROS (+)  Hematology negative hematology ROS (+)   Anesthesia Other Findings   Reproductive/Obstetrics                             Anesthesia Physical Anesthesia Plan  ASA: II  Anesthesia Plan: General   Post-op Pain Management:    Induction: Intravenous  PONV Risk Score and Plan:   Airway Management Planned: Oral ETT  Additional Equipment:   Intra-op Plan:   Post-operative Plan:   Informed Consent: I have reviewed the patients History and Physical, chart, labs and discussed the procedure including the risks, benefits and alternatives for the proposed anesthesia with the patient or authorized representative who has indicated his/her understanding and acceptance.     Dental advisory given  Plan Discussed with: CRNA and Surgeon  Anesthesia Plan Comments:         Anesthesia Quick Evaluation

## 2018-07-26 NOTE — Op Note (Signed)
Operative note   Surgeon:Colbie Danner Lawyer: None    Preop diagnosis: 1.  Left Achilles insertional tendinitis 2.  Left posterior calcaneal exostosis    Postop diagnosis: Same    Procedure: 1.  Achilles tendon repair with debridement left Achilles insertional site 2.  Calcaneal exostectomy left posterior calcaneus    EBL: Minimal    Anesthesia:local and general.  Local consisted of a one-to-one mixture of 0.25% bupivacaine with epinephrine and Exparel long-acting anesthetic preoperatively.  A total of 16 mL's was used.  In addition of 10 mL's of Exparel was used along the incision site post operatively.    Hemostasis: Epinephrine infiltrated along the incision site    Specimen: None    Complications: None    Operative indications:Eileen Garcia is an 38 y.o. that presents today for surgical intervention.  The risks/benefits/alternatives/complications have been discussed and consent has been given.    Procedure:  Patient was brought into the OR and placed on the operating table in theprone position. After anesthesia was obtained theleft lower extremity was prepped and draped in usual sterile fashion.  Attention was directed to the posterior aspect of the left Achilles insertional site on the calcaneus.  A longitudinal incision was performed.  Sharp and blunt dissection carried down to the peritenon.  Peritenon was then opened.  A longitudinal splitting incision was made at the Achilles tendon at its insertional site.  A moderate size posterior calcaneal exostosis was noted and this was dissected away from the tendon medial and lateral.  With the use of osteotomes and a power rasp the exostosis was then excised and smoothed down to healthy normal bone.  The wound was flushed with copious amounts of irrigation.  Attention was directed to the distal Achilles tendon insertional site where moderate sized calcified and fibrotic Achilles tendon was noted.  This was then excised sharply.   The area was further re-contoured.  Once again the wound was then flushed with copious amounts of irrigation.  The longitudinal split was then reapproximated with a 3-0 Vicryl.  Next a #2 FiberWire was used in a Kessler type of suture on the distal Achilles tendon.  A footprint 5.0 mm bone anchor was then placed into the posterior calcaneus and with tension the Achilles tendon was brought down to the bone anchor using standard technique.  Good stability was noted.  The wound was then flushed with copious amounts of irrigation.  The peritenon was then reapproximated with a 3-0 Vicryl as was the subcutaneous tissue.  The skin reapproximated with a 5-0 Monocryl.  Exparel was then infiltrated along the entire surgical site.  A bulky sterile dressing was then applied.  Patient was placed back into her equalizer walker boot for nonweightbearing.  Prescription for Percocet was sent to her pharmacy.    Patient tolerated the procedure and anesthesia well.  Was transported from the OR to the PACU with all vital signs stable and vascular status intact. To be discharged per routine protocol.  Will follow up in approximately 1 week in the outpatient clinic.

## 2018-07-26 NOTE — Progress Notes (Signed)
Patient called husband and updated him with times and process.

## 2018-07-26 NOTE — OR Nursing (Signed)
Awake and alert, denies nausea, taking ice chips, to BR for void, toler well.  Discharged to home.

## 2018-07-26 NOTE — Anesthesia Postprocedure Evaluation (Signed)
Anesthesia Post Note  Patient: Eileen Garcia  Procedure(s) Performed: ACHILLES TENDON REPAIR SECONDARY (Left )  Patient location during evaluation: PACU Anesthesia Type: General Level of consciousness: awake and alert and oriented Pain management: pain level controlled Vital Signs Assessment: post-procedure vital signs reviewed and stable Cardiovascular status: blood pressure returned to baseline Anesthetic complications: no     Last Vitals:  Vitals:   07/26/18 1112 07/26/18 1126  BP: 105/72 108/78  Pulse: (!) 58 69  Resp: 18 20  Temp:    SpO2: 99% 100%    Last Pain:  Vitals:   07/26/18 1126  TempSrc:   PainSc: 0-No pain                 [name]

## 2018-07-26 NOTE — H&P (Signed)
HISTORY AND PHYSICAL INTERVAL NOTE:  07/26/2018  7:17 AM  Eileen Garcia  has presented today for surgery, with the diagnosis of M77.32 EXOSTOSIS LEFT POSTERIOR CALCANEUS M76.62 ACHILLES TENDINITIS LEFT LOWER EXTREMITY.  The various methods of treatment have been discussed with the patient.  No guarantees were given.  After consideration of risks, benefits and other options for treatment, the patient has consented to surgery.  I have reviewed the patients' chart and labs.     A history and physical examination was performed in my office.  The patient was reexamined.  There have been no changes to this history and physical examination.  Eileen Garcia A

## 2018-07-26 NOTE — Anesthesia Post-op Follow-up Note (Signed)
Anesthesia QCDR form completed.        

## 2018-07-26 NOTE — Anesthesia Procedure Notes (Signed)
Procedure Name: Intubation Date/Time: 07/26/2018 7:51 AM Performed by: Caryl Asp, CRNA Pre-anesthesia Checklist: Patient identified, Emergency Drugs available, Suction available, Patient being monitored and Timeout performed Patient Re-evaluated:Patient Re-evaluated prior to induction Oxygen Delivery Method: Circle system utilized Preoxygenation: Pre-oxygenation with 100% oxygen Induction Type: IV induction Ventilation: Mask ventilation without difficulty Laryngoscope Size: Mac and 3 Grade View: Grade I Tube type: Oral Tube size: 7.0 mm Number of attempts: 1 Airway Equipment and Method: Stylet and Oral airway Placement Confirmation: ETT inserted through vocal cords under direct vision,  positive ETCO2 and breath sounds checked- equal and bilateral Tube secured with: Tape Dental Injury: Teeth and Oropharynx as per pre-operative assessment

## 2018-07-30 DIAGNOSIS — T50904A Poisoning by unspecified drugs, medicaments and biological substances, undetermined, initial encounter: Secondary | ICD-10-CM | POA: Diagnosis not present

## 2018-07-30 DIAGNOSIS — R21 Rash and other nonspecific skin eruption: Secondary | ICD-10-CM | POA: Diagnosis not present

## 2018-07-30 DIAGNOSIS — L299 Pruritus, unspecified: Secondary | ICD-10-CM | POA: Diagnosis not present

## 2018-08-06 DIAGNOSIS — G43719 Chronic migraine without aura, intractable, without status migrainosus: Secondary | ICD-10-CM | POA: Diagnosis not present

## 2018-08-06 DIAGNOSIS — M791 Myalgia, unspecified site: Secondary | ICD-10-CM | POA: Diagnosis not present

## 2018-08-06 DIAGNOSIS — G518 Other disorders of facial nerve: Secondary | ICD-10-CM | POA: Diagnosis not present

## 2018-08-06 DIAGNOSIS — M542 Cervicalgia: Secondary | ICD-10-CM | POA: Diagnosis not present

## 2018-08-06 DIAGNOSIS — G43839 Menstrual migraine, intractable, without status migrainosus: Secondary | ICD-10-CM | POA: Diagnosis not present

## 2018-08-07 DIAGNOSIS — M7732 Calcaneal spur, left foot: Secondary | ICD-10-CM | POA: Diagnosis not present

## 2018-09-02 DIAGNOSIS — L03032 Cellulitis of left toe: Secondary | ICD-10-CM | POA: Diagnosis not present

## 2018-09-02 DIAGNOSIS — L03031 Cellulitis of right toe: Secondary | ICD-10-CM | POA: Diagnosis not present

## 2018-09-03 DIAGNOSIS — J301 Allergic rhinitis due to pollen: Secondary | ICD-10-CM | POA: Diagnosis not present

## 2018-09-03 DIAGNOSIS — J4530 Mild persistent asthma, uncomplicated: Secondary | ICD-10-CM | POA: Diagnosis not present

## 2018-09-23 DIAGNOSIS — L03032 Cellulitis of left toe: Secondary | ICD-10-CM | POA: Diagnosis not present

## 2018-09-23 DIAGNOSIS — L03031 Cellulitis of right toe: Secondary | ICD-10-CM | POA: Diagnosis not present

## 2018-09-23 DIAGNOSIS — M7662 Achilles tendinitis, left leg: Secondary | ICD-10-CM | POA: Diagnosis not present

## 2018-10-07 DIAGNOSIS — H9202 Otalgia, left ear: Secondary | ICD-10-CM | POA: Diagnosis not present

## 2018-10-07 DIAGNOSIS — R05 Cough: Secondary | ICD-10-CM | POA: Diagnosis not present

## 2018-10-21 DIAGNOSIS — M791 Myalgia, unspecified site: Secondary | ICD-10-CM | POA: Diagnosis not present

## 2018-10-21 DIAGNOSIS — G43839 Menstrual migraine, intractable, without status migrainosus: Secondary | ICD-10-CM | POA: Diagnosis not present

## 2018-10-21 DIAGNOSIS — G518 Other disorders of facial nerve: Secondary | ICD-10-CM | POA: Diagnosis not present

## 2018-10-21 DIAGNOSIS — M542 Cervicalgia: Secondary | ICD-10-CM | POA: Diagnosis not present

## 2018-10-21 DIAGNOSIS — G43719 Chronic migraine without aura, intractable, without status migrainosus: Secondary | ICD-10-CM | POA: Diagnosis not present

## 2018-11-18 DIAGNOSIS — H748X9 Other specified disorders of middle ear and mastoid, unspecified ear: Secondary | ICD-10-CM | POA: Diagnosis not present

## 2018-11-18 DIAGNOSIS — H902 Conductive hearing loss, unspecified: Secondary | ICD-10-CM | POA: Diagnosis not present

## 2018-11-21 ENCOUNTER — Encounter: Payer: Self-pay | Admitting: Obstetrics and Gynecology

## 2018-12-17 DIAGNOSIS — D225 Melanocytic nevi of trunk: Secondary | ICD-10-CM | POA: Diagnosis not present

## 2018-12-17 DIAGNOSIS — R234 Changes in skin texture: Secondary | ICD-10-CM | POA: Diagnosis not present

## 2018-12-17 DIAGNOSIS — Z86018 Personal history of other benign neoplasm: Secondary | ICD-10-CM | POA: Diagnosis not present

## 2018-12-26 DIAGNOSIS — M542 Cervicalgia: Secondary | ICD-10-CM | POA: Diagnosis not present

## 2018-12-26 DIAGNOSIS — G43719 Chronic migraine without aura, intractable, without status migrainosus: Secondary | ICD-10-CM | POA: Diagnosis not present

## 2018-12-26 DIAGNOSIS — G43839 Menstrual migraine, intractable, without status migrainosus: Secondary | ICD-10-CM | POA: Diagnosis not present

## 2019-01-13 DIAGNOSIS — L919 Hypertrophic disorder of the skin, unspecified: Secondary | ICD-10-CM | POA: Diagnosis not present

## 2019-01-13 DIAGNOSIS — Z86018 Personal history of other benign neoplasm: Secondary | ICD-10-CM | POA: Diagnosis not present

## 2019-01-13 DIAGNOSIS — D485 Neoplasm of uncertain behavior of skin: Secondary | ICD-10-CM | POA: Diagnosis not present

## 2019-01-13 DIAGNOSIS — Z1283 Encounter for screening for malignant neoplasm of skin: Secondary | ICD-10-CM | POA: Diagnosis not present

## 2019-01-13 DIAGNOSIS — D2262 Melanocytic nevi of left upper limb, including shoulder: Secondary | ICD-10-CM | POA: Diagnosis not present

## 2019-01-13 DIAGNOSIS — D225 Melanocytic nevi of trunk: Secondary | ICD-10-CM | POA: Diagnosis not present

## 2019-01-24 ENCOUNTER — Other Ambulatory Visit: Payer: Self-pay | Admitting: Obstetrics and Gynecology

## 2019-01-24 DIAGNOSIS — Z3041 Encounter for surveillance of contraceptive pills: Secondary | ICD-10-CM

## 2019-02-17 ENCOUNTER — Other Ambulatory Visit: Payer: Self-pay | Admitting: Obstetrics and Gynecology

## 2019-02-17 DIAGNOSIS — Z3041 Encounter for surveillance of contraceptive pills: Secondary | ICD-10-CM

## 2019-03-20 DIAGNOSIS — J019 Acute sinusitis, unspecified: Secondary | ICD-10-CM | POA: Diagnosis not present

## 2019-03-20 DIAGNOSIS — J3489 Other specified disorders of nose and nasal sinuses: Secondary | ICD-10-CM | POA: Diagnosis not present

## 2019-03-20 DIAGNOSIS — J301 Allergic rhinitis due to pollen: Secondary | ICD-10-CM | POA: Diagnosis not present

## 2019-04-06 NOTE — Progress Notes (Signed)
PCP:  [name], MD   Chief Complaint  Patient presents with  . Gynecologic Exam  . Vaginal Bleeding     HPI:      [name] is a 39 y.o. G4P0040 who LMP was Patient's last menstrual period was 03/23/2019., presents today for her annual examination.  Her menses are infrequent with Kyleena and POPs. Stopped POPs 12/20 due to BTB. Has had spotting and bleeding since 03/23/19 with dysmen. Would rather not be on POPs again if bleeding normal. She has a hx of PCOS. Skyla placed 2017 due to anovulatory bleeding/menorrhagia with Dr. Laurey Morale. Suspicion of endometriosis given hx of pelvic pain. Failed mult OCPs, ortho evra and depo. Hx of migraines with aura. Had BTB with Skyla, so POPs added for cycle control. Neg GYN u/s 11/18.  Sex activity: single partner, contraception - IUD. Kyleena placed 07/08/18. Pt with long hx of dyspareunia. Last Pap: 02/11/18  Results were: no abnormalities /neg HPV DNA  Hx of STDs: none  There is no FH of breast cancer. There is a FH of uterine vs ovarian cancer in PGM (no chemo/rad done), genetic testing not done. The patient does do self-breast exams.  Tobacco use: 1/3-1/2 ppd; not ready to quit Alcohol use: social drinker No drug use.  Exercise: moderately active  She does not get adequate calcium and Vitamin D in her diet.   Past Medical History:  Diagnosis Date  . Amenorrhea   . Anxiety   . Asthma    allergy induced  . Complication of anesthesia    pt states she was given sedation prior to her nasal surgery in preop, then pt woke up on her way back to [place] pts surgery pt was very agitated and yelling   . Family history of adverse reaction to anesthesia    dad-vomits and very emotional after surgery  . GERD (gastroesophageal reflux disease)    no meds  . Headache   . History of kidney stones 04/2018  . Lumbar back pain 07/23/2018  . Menorrhagia   . Migraine with aura   . Missed abortion   . Pelvic pain   . Polycystic ovaries   .  PONV (postoperative nausea and vomiting)     Past Surgical History:  Procedure Laterality Date  . ACHILLES TENDON SURGERY Left 07/26/2018   Procedure: ACHILLES TENDON REPAIR SECONDARY;  Surgeon: Samara Deist, DPM;  Location: [facility] ORS;  Service: Podiatry;  Laterality: Left;  . NASAL SEPTUM SURGERY  2012-2013   with Dr Kathyrn Sheriff  . TYMPANOSTOMY TUBE PLACEMENT     3-4 different surgeries  . WISDOM TOOTH EXTRACTION      Family History  Problem Relation Age of Onset  . Hypertension Father   . Migraines Paternal Grandmother   . Diabetes Paternal Grandmother        type 1  . Hypertension Paternal Grandmother   . Uterine cancer Paternal Grandmother 52       vs ovar--no chemo/rad done    Social History   Socioeconomic History  . Marital status: Married    Spouse name: Not on file  . Number of children: Not on file  . Years of education: Not on file  . Highest education level: Not on file  Occupational History  . Not on file  Tobacco Use  . Smoking status: Former Smoker    Packs/day: 0.50    Years: 10.00    Pack years: 5.00    Types: Cigarettes    Quit date:  07/22/2008    Years since quitting: 10.7  . Smokeless tobacco: Never Used  Substance and Sexual Activity  . Alcohol use: Yes    Comment: rare  . Drug use: No  . Sexual activity: Yes    Birth control/protection: I.U.D.    Comment: Kyleena  Other Topics Concern  . Not on file  Social History Narrative  . Not on file   Social Determinants of Health   Financial Resource Strain:   . Difficulty of Paying Living Expenses: Not on file  Food Insecurity:   . Worried About Charity fundraiser in the Last Year: Not on file  . Ran Out of Food in the Last Year: Not on file  Transportation Needs:   . Lack of Transportation (Medical): Not on file  . Lack of Transportation (Non-Medical): Not on file  Physical Activity:   . Days of Exercise per Week: Not on file  . Minutes of Exercise per Session: Not on file  Stress:   .  Feeling of Stress : Not on file  Social Connections:   . Frequency of Communication with Friends and Family: Not on file  . Frequency of Social Gatherings with Friends and Family: Not on file  . Attends Religious Services: Not on file  . Active Member of Clubs or Organizations: Not on file  . Attends Archivist Meetings: Not on file  . Marital Status: Not on file  Intimate Partner Violence:   . Fear of Current or Ex-Partner: Not on file  . Emotionally Abused: Not on file  . Physically Abused: Not on file  . Sexually Abused: Not on file    Current Meds  Medication Sig  . AIMOVIG 70 MG/ML SOAJ Inject 70 mg into the skin every 30 (thirty) days.   Marland Kitchen albuterol (VENTOLIN HFA) 108 (90 Base) MCG/ACT inhaler Inhale 1-2 puffs into the lungs every 6 (six) hours as needed for wheezing or shortness of breath.  . cetirizine (ZYRTEC) 10 MG tablet Take 10 mg by mouth at bedtime.   . [DISCONTINUED] Levonorgestrel (SKYLA) 13.5 MG IUD by Intrauterine route.     ROS:  Review of Systems  Constitutional: Negative for fatigue, fever and unexpected weight change.  Respiratory: Negative for cough, shortness of breath and wheezing.   Cardiovascular: Negative for chest pain, palpitations and leg swelling.  Gastrointestinal: Negative for blood in stool, constipation, diarrhea, nausea and vomiting.  Endocrine: Negative for cold intolerance, heat intolerance and polyuria.  Genitourinary: Positive for dyspareunia. Negative for dysuria, flank pain, frequency, genital sores, hematuria, menstrual problem, pelvic pain, urgency, vaginal bleeding, vaginal discharge and vaginal pain.  Musculoskeletal: Negative for back pain, joint swelling and myalgias.  Skin: Negative for rash.  Neurological: Negative for dizziness, syncope, light-headedness, numbness and headaches.  Hematological: Negative for adenopathy.  Psychiatric/Behavioral: Negative for agitation, confusion, sleep disturbance and suicidal ideas. The  patient is not nervous/anxious.      Objective: BP 120/88   Ht 5\' 1"  (1.549 m)   Wt 194 lb (88 kg)   LMP 03/23/2019   BMI 36.66 kg/m    Physical Exam Constitutional:      Appearance: She is well-developed.  Genitourinary:     Vulva, vagina, cervix, uterus, right adnexa and left adnexa normal.     No vulval lesion or tenderness noted.     No vaginal discharge, erythema, tenderness or bleeding.     No cervical polyp.     No IUD strings visualized.     Uterus is  not enlarged or tender.     No right or left adnexal mass present.     Right adnexa not tender.     Left adnexa not tender.     Genitourinary Comments: IUD STRINGS NOT IN CX OS  Neck:     Thyroid: No thyromegaly.  Cardiovascular:     Rate and Rhythm: Normal rate and regular rhythm.     Heart sounds: Normal heart sounds. No murmur.  Pulmonary:     Effort: Pulmonary effort is normal.     Breath sounds: Normal breath sounds.  Chest:     Breasts:        Right: No mass, nipple discharge, skin change or tenderness.        Left: No mass, nipple discharge, skin change or tenderness.  Abdominal:     Palpations: Abdomen is soft.     Tenderness: There is no abdominal tenderness. There is no guarding.  Musculoskeletal:        General: Normal range of motion.     Cervical back: Normal range of motion.  Neurological:     General: No focal deficit present.     Mental Status: She is alert and oriented to person, place, and time.     Cranial Nerves: No cranial nerve deficit.  Skin:    General: Skin is warm and dry.  Psychiatric:        Mood and Affect: Mood normal.        Behavior: Behavior normal.        Thought Content: Thought content normal.        Judgment: Judgment normal.  Vitals and nursing note reviewed.    RESULTS:  ULTRASOUND REPORT  Location: [facility]  Date of Service: 04/07/2019    Indications: IUD check Findings:  The uterus is anteverted and measures 7.3 x 3.5 x 3.1 cm.  Echo texture  is homogenous without evidence of focal masses. The Endometrium measures 1.6 mm. The IUD is correctly placed within the uterus.   Right Ovary measures 2.6 x 1.8 x 1.1 cm. It is normal in appearance. Left Ovary measures 1.7 x 1.1 x 1.2 cm. It is normal in appearance. Survey of the adnexa demonstrates no adnexal masses. There is no free fluid in the cul de sac.  Impression: 1. Normal pelvic ultrasound.  2. The IUD is correctly placed within the uterus.   Recommendations: 1.Clinical correlation with the patient's History and Physical Exam.  Gweneth Dimitri, RT  Assessment/Plan: Encounter for annual routine gynecological examination  Encounter for routine checking of intrauterine contraceptive device (IUD) - Plan: US PELVIS TRANSVAGINAL NON-OB (TV ONLY)  Intrauterine contraceptive device threads lost, initial encounter - Plan: US PELVIS TRANSVAGINAL NON-OB (TV ONLY); Normal Gyn u/s. IUD in correct location. Reassurance. F/u prn.   Breakthrough bleeding with IUD--Normal to have sx. See if sx persist. Can always add POP again but GYN u/s neg. F/u prn.     GYN counsel adequate intake of calcium and vitamin D, diet and exercise     F/U  Return in about 1 year (around 04/06/2020).  Sheamus Hasting B. Alexiana Laverdure, PA-C 04/07/2019 5:11 PM

## 2019-04-07 ENCOUNTER — Other Ambulatory Visit: Payer: Self-pay

## 2019-04-07 ENCOUNTER — Ambulatory Visit (INDEPENDENT_AMBULATORY_CARE_PROVIDER_SITE_OTHER): Payer: BC Managed Care – PPO | Admitting: Obstetrics and Gynecology

## 2019-04-07 ENCOUNTER — Ambulatory Visit (INDEPENDENT_AMBULATORY_CARE_PROVIDER_SITE_OTHER): Payer: BC Managed Care – PPO

## 2019-04-07 ENCOUNTER — Encounter: Payer: Self-pay | Admitting: Obstetrics and Gynecology

## 2019-04-07 VITALS — BP 120/88 | Ht 61.0 in | Wt 194.0 lb

## 2019-04-07 DIAGNOSIS — N921 Excessive and frequent menstruation with irregular cycle: Secondary | ICD-10-CM

## 2019-04-07 DIAGNOSIS — Z30431 Encounter for routine checking of intrauterine contraceptive device: Secondary | ICD-10-CM

## 2019-04-07 DIAGNOSIS — Z01419 Encounter for gynecological examination (general) (routine) without abnormal findings: Secondary | ICD-10-CM

## 2019-04-07 DIAGNOSIS — T8332XA Displacement of intrauterine contraceptive device, initial encounter: Secondary | ICD-10-CM | POA: Diagnosis not present

## 2019-04-07 DIAGNOSIS — N941 Unspecified dyspareunia: Secondary | ICD-10-CM

## 2019-04-07 DIAGNOSIS — Z975 Presence of (intrauterine) contraceptive device: Secondary | ICD-10-CM

## 2019-04-07 NOTE — Patient Instructions (Signed)
I value your feedback and entrusting us with your care. If you get a [facility] patient survey, I would appreciate you taking the time to let us know about your experience today. Thank you!  As of January 16, 2019, your lab results will be released to your MyChart immediately, before I even have a chance to see them. Please give me time to review them and contact you if there are any abnormalities. Thank you for your patience.  

## 2019-05-05 DIAGNOSIS — G43839 Menstrual migraine, intractable, without status migrainosus: Secondary | ICD-10-CM | POA: Diagnosis not present

## 2019-05-05 DIAGNOSIS — G43719 Chronic migraine without aura, intractable, without status migrainosus: Secondary | ICD-10-CM | POA: Diagnosis not present

## 2019-05-05 DIAGNOSIS — M542 Cervicalgia: Secondary | ICD-10-CM | POA: Diagnosis not present

## 2019-07-22 DIAGNOSIS — M7732 Calcaneal spur, left foot: Secondary | ICD-10-CM | POA: Diagnosis not present

## 2019-07-22 DIAGNOSIS — M79672 Pain in left foot: Secondary | ICD-10-CM | POA: Diagnosis not present

## 2019-07-22 DIAGNOSIS — M7662 Achilles tendinitis, left leg: Secondary | ICD-10-CM | POA: Diagnosis not present

## 2019-07-29 DIAGNOSIS — M6281 Muscle weakness (generalized): Secondary | ICD-10-CM | POA: Diagnosis not present

## 2019-07-29 DIAGNOSIS — M7662 Achilles tendinitis, left leg: Secondary | ICD-10-CM | POA: Diagnosis not present

## 2019-07-29 DIAGNOSIS — M25672 Stiffness of left ankle, not elsewhere classified: Secondary | ICD-10-CM | POA: Diagnosis not present

## 2019-08-01 DIAGNOSIS — M7662 Achilles tendinitis, left leg: Secondary | ICD-10-CM | POA: Diagnosis not present

## 2019-08-01 DIAGNOSIS — M6281 Muscle weakness (generalized): Secondary | ICD-10-CM | POA: Diagnosis not present

## 2019-08-01 DIAGNOSIS — M25672 Stiffness of left ankle, not elsewhere classified: Secondary | ICD-10-CM | POA: Diagnosis not present

## 2019-08-04 DIAGNOSIS — M6281 Muscle weakness (generalized): Secondary | ICD-10-CM | POA: Diagnosis not present

## 2019-08-04 DIAGNOSIS — M7662 Achilles tendinitis, left leg: Secondary | ICD-10-CM | POA: Diagnosis not present

## 2019-08-04 DIAGNOSIS — M25672 Stiffness of left ankle, not elsewhere classified: Secondary | ICD-10-CM | POA: Diagnosis not present

## 2019-08-07 DIAGNOSIS — M7662 Achilles tendinitis, left leg: Secondary | ICD-10-CM | POA: Diagnosis not present

## 2019-08-13 DIAGNOSIS — M7662 Achilles tendinitis, left leg: Secondary | ICD-10-CM | POA: Diagnosis not present

## 2019-08-25 DIAGNOSIS — M7662 Achilles tendinitis, left leg: Secondary | ICD-10-CM | POA: Diagnosis not present

## 2019-08-25 DIAGNOSIS — M25672 Stiffness of left ankle, not elsewhere classified: Secondary | ICD-10-CM | POA: Diagnosis not present

## 2019-08-25 DIAGNOSIS — M6281 Muscle weakness (generalized): Secondary | ICD-10-CM | POA: Diagnosis not present

## 2019-08-27 DIAGNOSIS — S39012A Strain of muscle, fascia and tendon of lower back, initial encounter: Secondary | ICD-10-CM | POA: Diagnosis not present

## 2019-09-09 DIAGNOSIS — G43719 Chronic migraine without aura, intractable, without status migrainosus: Secondary | ICD-10-CM | POA: Diagnosis not present

## 2019-09-09 DIAGNOSIS — G43839 Menstrual migraine, intractable, without status migrainosus: Secondary | ICD-10-CM | POA: Diagnosis not present

## 2019-09-09 DIAGNOSIS — M542 Cervicalgia: Secondary | ICD-10-CM | POA: Diagnosis not present

## 2019-09-25 DIAGNOSIS — H6980 Other specified disorders of Eustachian tube, unspecified ear: Secondary | ICD-10-CM | POA: Diagnosis not present

## 2019-09-25 DIAGNOSIS — J301 Allergic rhinitis due to pollen: Secondary | ICD-10-CM | POA: Diagnosis not present

## 2019-10-24 DIAGNOSIS — Z20822 Contact with and (suspected) exposure to covid-19: Secondary | ICD-10-CM | POA: Diagnosis not present

## 2019-10-24 DIAGNOSIS — U071 COVID-19: Secondary | ICD-10-CM | POA: Diagnosis not present

## 2019-12-03 DIAGNOSIS — G43839 Menstrual migraine, intractable, without status migrainosus: Secondary | ICD-10-CM | POA: Diagnosis not present

## 2019-12-03 DIAGNOSIS — M542 Cervicalgia: Secondary | ICD-10-CM | POA: Diagnosis not present

## 2019-12-03 DIAGNOSIS — G43719 Chronic migraine without aura, intractable, without status migrainosus: Secondary | ICD-10-CM | POA: Diagnosis not present

## 2020-01-13 DIAGNOSIS — G43839 Menstrual migraine, intractable, without status migrainosus: Secondary | ICD-10-CM | POA: Diagnosis not present

## 2020-01-13 DIAGNOSIS — G43719 Chronic migraine without aura, intractable, without status migrainosus: Secondary | ICD-10-CM | POA: Diagnosis not present

## 2020-01-13 DIAGNOSIS — M542 Cervicalgia: Secondary | ICD-10-CM | POA: Diagnosis not present

## 2020-01-27 ENCOUNTER — Ambulatory Visit: Payer: BC Managed Care – PPO | Admitting: Dermatology

## 2020-01-27 ENCOUNTER — Other Ambulatory Visit: Payer: Self-pay

## 2020-01-27 ENCOUNTER — Encounter: Payer: Self-pay | Admitting: Dermatology

## 2020-01-27 DIAGNOSIS — R202 Paresthesia of skin: Secondary | ICD-10-CM

## 2020-01-27 DIAGNOSIS — L820 Inflamed seborrheic keratosis: Secondary | ICD-10-CM

## 2020-01-27 NOTE — Progress Notes (Signed)
   Follow-Up Visit   Subjective  Eileen Garcia is a 39 y.o. female who presents for the following: Brown spot (Right preauricular. Spot present for years, but got pink and itchy last week. She also has pain on her right upper back for several months, but can't see anything on the skin.). No history of shingles. She does have a history of a back injury years ago.  The following portions of the chart were reviewed this encounter and updated as appropriate:       Review of Systems:  No other skin or systemic complaints except as noted in HPI or Assessment and Plan.  Objective  Well appearing patient in no apparent distress; mood and affect are within normal limits.  A focused examination was performed including face. Relevant physical exam findings are noted in the Assessment and Plan.  Objective  Right Preauricular Area: Erythematous keratotic or waxy stuck-on papule or plaque.   Objective  Right Upper Back: Skin clear visually and by palpation   Assessment & Plan  Inflamed seborrheic keratosis Right Preauricular Area  Destruction of lesion - Right Preauricular Area  Destruction method: cryotherapy   Informed consent: discussed and consent obtained   Lesion destroyed using liquid nitrogen: Yes   Region frozen until ice ball extended beyond lesion: Yes   Outcome: patient tolerated procedure well with no complications   Post-procedure details: wound care instructions given    Notalgia paresthetica Right Upper Back  Discussed condition with patient. Chronic, No cure  Discussed OTC menthol, Benzocaine, Voltaren if needed for pain/itch.  Return in about 3 months (around 04/26/2020) for TBSE.   IJamesetta Orleans, CMA, am acting as scribe for Brendolyn Patty, MD .  Documentation: I have reviewed the above documentation for accuracy and completeness, and I agree with the above.  Brendolyn Patty MD

## 2020-01-27 NOTE — Patient Instructions (Signed)

## 2020-02-18 DIAGNOSIS — M542 Cervicalgia: Secondary | ICD-10-CM | POA: Diagnosis not present

## 2020-02-18 DIAGNOSIS — G43839 Menstrual migraine, intractable, without status migrainosus: Secondary | ICD-10-CM | POA: Diagnosis not present

## 2020-02-18 DIAGNOSIS — G43719 Chronic migraine without aura, intractable, without status migrainosus: Secondary | ICD-10-CM | POA: Diagnosis not present

## 2020-03-03 DIAGNOSIS — R300 Dysuria: Secondary | ICD-10-CM | POA: Diagnosis not present

## 2020-03-04 ENCOUNTER — Other Ambulatory Visit: Payer: Self-pay

## 2020-03-04 ENCOUNTER — Encounter: Payer: Self-pay | Admitting: Obstetrics and Gynecology

## 2020-03-04 ENCOUNTER — Ambulatory Visit (INDEPENDENT_AMBULATORY_CARE_PROVIDER_SITE_OTHER): Payer: BC Managed Care – PPO | Admitting: Obstetrics and Gynecology

## 2020-03-04 VITALS — BP 128/84 | HR 90 | Temp 98.7°F | Resp 16 | Ht 61.0 in | Wt 199.0 lb

## 2020-03-04 DIAGNOSIS — R350 Frequency of micturition: Secondary | ICD-10-CM | POA: Diagnosis not present

## 2020-03-04 LAB — POCT URINALYSIS DIPSTICK
Bilirubin, UA: NEGATIVE
Blood, UA: NEGATIVE
Glucose, UA: NEGATIVE
Ketones, UA: NEGATIVE
Leukocytes, UA: NEGATIVE
Nitrite, UA: NEGATIVE
Protein, UA: NEGATIVE
Spec Grav, UA: 1.02 (ref 1.010–1.025)
Urobilinogen, UA: 0.2 E.U./dL
pH, UA: 6 (ref 5.0–8.0)

## 2020-03-04 NOTE — Progress Notes (Signed)
Eileen Kayser, MD   Chief Complaint  Patient presents with  . Urinary Tract Infection    HPI:      [name] is a 40 y.o. G4P0040 whose LMP was No LMP recorded. (Menstrual status: IUD)., presents today for UTI sx, referred by PCP. Told to f/u with [place] due to sx. Having dysuria, frequency, urgency with increased urge incont last wk. Drinks 1 caffeinated drink a few times a wk, little water intake. No vag sx. Nothing changed today. Hasn't started AZO yet. Hx of UTIs in past Saw PCP yesterday, had neg UA, C&S sent, awaiting results.   Past Medical History:  Diagnosis Date  . Amenorrhea   . Anxiety   . Asthma    allergy induced  . Atypical mole 12/17/2017   Atypical combined nevus, Right upper arm, excision  . Complication of anesthesia    pt states she was given sedation prior to her nasal surgery in preop, then pt woke up on her way back to [place] pts surgery pt was very agitated and yelling   . Dysplastic nevus 11/2017   L mid upper back lateral , moderate  . Family history of adverse reaction to anesthesia    dad-vomits and very emotional after surgery  . GERD (gastroesophageal reflux disease)    no meds  . Headache   . History of kidney stones 04/2018  . Lumbar back pain 07/23/2018  . Menorrhagia   . Migraine with aura   . Missed abortion   . Pelvic pain   . Polycystic ovaries   . PONV (postoperative nausea and vomiting)     Past Surgical History:  Procedure Laterality Date  . ACHILLES TENDON SURGERY Left 07/26/2018   Procedure: ACHILLES TENDON REPAIR SECONDARY;  Surgeon: Samara Deist, DPM;  Location: [facility] ORS;  Service: Podiatry;  Laterality: Left;  . NASAL SEPTUM SURGERY  2012-2013   with Dr Kathyrn Sheriff  . TYMPANOSTOMY TUBE PLACEMENT     3-4 different surgeries  . WISDOM TOOTH EXTRACTION      Family History  Problem Relation Age of Onset  . Hypertension Father   . Migraines Paternal Grandmother   . Diabetes Paternal Grandmother        type 1  .  Hypertension Paternal Grandmother   . Uterine cancer Paternal Grandmother 56       vs ovar--no chemo/rad done    Social History   Socioeconomic History  . Marital status: Married    Spouse name: Not on file  . Number of children: Not on file  . Years of education: Not on file  . Highest education level: Not on file  Occupational History  . Not on file  Tobacco Use  . Smoking status: Former Smoker    Packs/day: 0.50    Years: 10.00    Pack years: 5.00    Types: Cigarettes    Quit date: 07/22/2008    Years since quitting: 11.6  . Smokeless tobacco: Never Used  Vaping Use  . Vaping Use: Never used  Substance and Sexual Activity  . Alcohol use: Yes    Comment: rare  . Drug use: No  . Sexual activity: Yes    Birth control/protection: I.U.D.    Comment: Kyleena  Other Topics Concern  . Not on file  Social History Narrative  . Not on file   Social Determinants of Health   Financial Resource Strain: Not on file  Food Insecurity: Not on file  Transportation Needs: Not on  file  Physical Activity: Not on file  Stress: Not on file  Social Connections: Not on file  Intimate Partner Violence: Not on file    Outpatient Medications Prior to Visit  Medication Sig Dispense Refill  . AIMOVIG 70 MG/ML SOAJ Inject 70 mg into the skin every 30 (thirty) days.     . cetirizine (ZYRTEC) 10 MG tablet Take 10 mg by mouth at bedtime.     Marland Kitchen albuterol (VENTOLIN HFA) 108 (90 Base) MCG/ACT inhaler Inhale 1-2 puffs into the lungs every 6 (six) hours as needed for wheezing or shortness of breath. (Patient not taking: No sig reported)    . EPINEPHrine 0.3 mg/0.3 mL IJ SOAJ injection Inject 0.3 mg into the muscle as needed for anaphylaxis.  (Patient not taking: No sig reported)    . Levonorgestrel (KYLEENA) 19.5 MG IUD 1 each (19.5 mg total) by Intrauterine route once for 1 dose. 1 each 0  . methocarbamol (ROBAXIN) 500 MG tablet Take 500 mg by mouth 4 (four) times daily as needed.  (Patient not  taking: No sig reported)    . norethindrone (MICRONOR,CAMILA,ERRIN) 0.35 MG tablet Take 1 tablet (0.35 mg total) by mouth daily. (Patient not taking: No sig reported) 84 tablet 3   No facility-administered medications prior to visit.      ROS:  Review of Systems BREAST: No symptoms   OBJECTIVE:   Vitals:  BP 128/84   Pulse 90   Temp 98.7 F (37.1 C)   Resp 16   Ht 5\' 1"  (1.549 m)   Wt 199 lb (90.3 kg)   SpO2 99%   BMI 37.60 kg/m   Physical Exam  Results: Results for orders placed or performed in visit on 03/04/20 (from the past 24 hour(s))  POCT urinalysis dipstick     Status: None   Collection Time: 03/04/20 10:09 AM  Result Value Ref Range   Color, UA yellow    Clarity, UA clear    Glucose, UA Negative Negative   Bilirubin, UA negative    Ketones, UA negative    Spec Grav, UA 1.020 1.010 - 1.025   Blood, UA negative    pH, UA 6.0 5.0 - 8.0   Protein, UA Negative Negative   Urobilinogen, UA 0.2 0.2 or 1.0 E.U./dL   Nitrite, UA negative    Leukocytes, UA Negative Negative     Assessment/Plan: Urinary frequency - Plan: POCT urinalysis dipstick; awaiting C&S results from PCP. If has UTI, nothing for GYN to do. If C&S neg and sx persist, pt to f/u. No caffeine, increase water intake. Could be bladder spasms vs urethritis. Has hx of urge incont anyway.  N/C TO PT SINCE NOTHING TO DO BUT AWAIT RESULTS   Return if symptoms worsen or fail to improve.  Tico Crotteau B. Talor Cheema, PA-C 03/04/2020 10:47 AM

## 2020-03-04 NOTE — Patient Instructions (Signed)
I value your feedback and you entrusting us with your care. If you get a [facility] patient survey, I would appreciate you taking the time to let us know about your experience today. Thank you! ? ? ?

## 2020-04-05 ENCOUNTER — Encounter: Payer: Self-pay | Admitting: Obstetrics and Gynecology

## 2020-04-26 DIAGNOSIS — G43839 Menstrual migraine, intractable, without status migrainosus: Secondary | ICD-10-CM | POA: Diagnosis not present

## 2020-04-26 DIAGNOSIS — M542 Cervicalgia: Secondary | ICD-10-CM | POA: Diagnosis not present

## 2020-04-26 DIAGNOSIS — G43719 Chronic migraine without aura, intractable, without status migrainosus: Secondary | ICD-10-CM | POA: Diagnosis not present

## 2020-05-14 ENCOUNTER — Emergency Department
Admission: EM | Admit: 2020-05-14 | Discharge: 2020-05-14 | Disposition: A | Discharge status: Home / Self Care | Payer: BC Managed Care – PPO | Attending: Student in an Organized Health Care Education/Training Program | Admitting: Student in an Organized Health Care Education/Training Program

## 2020-05-14 ENCOUNTER — Other Ambulatory Visit: Payer: Self-pay

## 2020-05-14 DIAGNOSIS — X58XXXA Exposure to other specified factors, initial encounter: Secondary | ICD-10-CM | POA: Insufficient documentation

## 2020-05-14 DIAGNOSIS — J45909 Unspecified asthma, uncomplicated: Secondary | ICD-10-CM | POA: Insufficient documentation

## 2020-05-14 DIAGNOSIS — Z91040 Latex allergy status: Secondary | ICD-10-CM | POA: Insufficient documentation

## 2020-05-14 DIAGNOSIS — T781XXA Other adverse food reactions, not elsewhere classified, initial encounter: Secondary | ICD-10-CM | POA: Insufficient documentation

## 2020-05-14 DIAGNOSIS — R030 Elevated blood-pressure reading, without diagnosis of hypertension: Secondary | ICD-10-CM

## 2020-05-14 DIAGNOSIS — L299 Pruritus, unspecified: Secondary | ICD-10-CM | POA: Diagnosis not present

## 2020-05-14 DIAGNOSIS — R202 Paresthesia of skin: Secondary | ICD-10-CM | POA: Insufficient documentation

## 2020-05-14 DIAGNOSIS — T7840XA Allergy, unspecified, initial encounter: Secondary | ICD-10-CM | POA: Diagnosis not present

## 2020-05-14 DIAGNOSIS — Z87891 Personal history of nicotine dependence: Secondary | ICD-10-CM | POA: Diagnosis not present

## 2020-05-14 MED ORDER — HYDROXYZINE HCL 50 MG PO TABS: 50.0000 mg | ORAL_TABLET | Freq: Three times a day (TID) | ORAL | 0 refills | Status: DC | PRN

## 2020-05-14 MED ORDER — DEXAMETHASONE SODIUM PHOSPHATE 10 MG/ML IJ SOLN
10.0000 mg | Freq: Once | INTRAMUSCULAR | Status: AC
  Administered 2020-05-14: 10 mg via INTRAMUSCULAR
  Filled 2020-05-14: qty 1

## 2020-05-14 MED ORDER — METHYLPREDNISOLONE 4 MG PO TBPK: ORAL_TABLET | ORAL | 0 refills | Status: DC

## 2020-05-14 MED ORDER — HYDROXYZINE HCL 50 MG PO TABS
50.0000 mg | ORAL_TABLET | Freq: Once | ORAL | Status: AC
  Administered 2020-05-14: 50 mg via ORAL
  Filled 2020-05-14: qty 1

## 2020-05-14 NOTE — ED Notes (Signed)
See triage note  Presents with with possible allergic rxn  States she developed itching after eating last pm   States she did take some benadryl last pm  Lips tingling

## 2020-05-14 NOTE — Discharge Instructions (Signed)
Follow discharge care instruction take medication as directed.  Advised to have a 3-day blood pressure check secondary to readings today 156/108.

## 2020-05-14 NOTE — ED Provider Notes (Signed)
[facility] Emergency Department Provider Note   ____________________________________________   Event Date/Time   First MD Initiated Contact with Patient 05/14/20 1120     (approximate)  I have reviewed the triage vital signs and the nursing notes.   HISTORY  Chief Complaint Allergic Reaction    HPI Eileen Garcia is a 41 y.o. female patient complain of itching swallowing secondary to eating a burger last night at [employer].  Patient states she took a Benadryl and went to bed and awakened this morning with the itching still present.  Patient also states there is numbness and tingling to her lips.  No obvious swelling to the lips or face.  Patient states incident occurred before after eating at the [facility].  Denies dyspnea.  Patient is able to tolerate food and fluids.      Past Medical History:  Diagnosis Date  . Amenorrhea   . Anxiety   . Asthma    allergy induced  . Atypical mole 12/17/2017   Atypical combined nevus, Right upper arm, excision  . Complication of anesthesia    pt states she was given sedation prior to her nasal surgery in preop, then pt woke up on her way back to [place] pts surgery pt was very agitated and yelling   . Dysplastic nevus 11/2017   L mid upper back lateral , moderate  . Family history of adverse reaction to anesthesia    dad-vomits and very emotional after surgery  . GERD (gastroesophageal reflux disease)    no meds  . Headache   . History of kidney stones 04/2018  . Lumbar back pain 07/23/2018  . Menorrhagia   . Migraine with aura   . Missed abortion   . Pelvic pain   . Polycystic ovaries   . PONV (postoperative nausea and vomiting)     Patient Active Problem List   Diagnosis Date Noted  . History of multiple miscarriages 02/11/2018  . PCOS (polycystic ovarian syndrome) 12/14/2016  . Menometrorrhagia 10/12/2016    Past Surgical History:  Procedure Laterality Date  . ACHILLES TENDON  SURGERY Left 07/26/2018   Procedure: ACHILLES TENDON REPAIR SECONDARY;  Surgeon: Samara Deist, DPM;  Location: [facility] ORS;  Service: Podiatry;  Laterality: Left;  . NASAL SEPTUM SURGERY  2012-2013   with Dr Kathyrn Sheriff  . TYMPANOSTOMY TUBE PLACEMENT     3-4 different surgeries  . WISDOM TOOTH EXTRACTION      Prior to Admission medications   Medication Sig Start Date End Date Taking? Authorizing Provider  hydrOXYzine (ATARAX/VISTARIL) 50 MG tablet Take 1 tablet (50 mg total) by mouth 3 (three) times daily as needed for itching. 05/14/20  Yes Sable Feil, PA-C  methylPREDNISolone (MEDROL DOSEPAK) 4 MG TBPK tablet Take Tapered dose as directed 05/14/20  Yes Sable Feil, PA-C  AIMOVIG 70 MG/ML SOAJ Inject 70 mg into the skin every 30 (thirty) days.  06/19/18   [provider]  cetirizine (ZYRTEC) 10 MG tablet Take 10 mg by mouth at bedtime.     [provider]  Levonorgestrel (KYLEENA) 19.5 MG IUD 1 each (19.5 mg total) by Intrauterine route once for 1 dose. 07/08/18 02/25/12  Copland, Deirdre Evener, PA-C    Allergies Imipramine, Phentermine, Latex, Meloxicam, Penicillins, Sulfa antibiotics, and Tape  Family History  Problem Relation Age of Onset  . Hypertension Father   . Migraines Paternal Grandmother   . Diabetes Paternal Grandmother        type 1  .  Hypertension Paternal Grandmother   . Uterine cancer Paternal Grandmother 61       vs ovar--no chemo/rad done    Social History Social History   Tobacco Use  . Smoking status: Former Smoker    Packs/day: 0.50    Years: 10.00    Pack years: 5.00    Types: Cigarettes    Quit date: 07/22/2008    Years since quitting: 11.8  . Smokeless tobacco: Never Used  Vaping Use  . Vaping Use: Never used  Substance Use Topics  . Alcohol use: Yes    Comment: rare  . Drug use: No    Review of Systems  Constitutional: No fever/chills Eyes: No visual changes. ENT: No sore throat. Cardiovascular: Denies chest pain. Respiratory:  Denies shortness of breath. Gastrointestinal: No abdominal pain.  No nausea, no vomiting.  No diarrhea.  No constipation. Genitourinary: Negative for dysuria. Musculoskeletal: Negative for back pain. Skin: Negative for rash. Neurological: Negative for headaches, focal weakness or numbness. Psychiatric:  Anxiety Allergic/Immunilogical: Imipramine, Phentermine, Latex, Meloxicam, Penicillin, Sulfa, and Tape. ____________________________________________   PHYSICAL EXAM:  VITAL SIGNS: ED Triage Vitals  Enc Vitals Group     BP 05/14/20 1112 (!) 156/108     Pulse Rate 05/14/20 1112 (!) 104     Resp 05/14/20 1112 18     Temp 05/14/20 1112 98.3 F (36.8 C)     Temp Source 05/14/20 1112 Oral     SpO2 05/14/20 1112 100 %     Weight 05/14/20 1113 189 lb (85.7 kg)     Height 05/14/20 1113 5\' 1"  (1.549 m)     Head Circumference --      Peak Flow --      Pain Score 05/14/20 1113 0     Pain Loc --      Pain Edu? --      Excl. in [place]? --     Constitutional: Alert and oriented. Well appearing and in no acute distress. Eyes: Conjunctivae are normal. PERRL. EOMI. Head: Atraumatic. Nose: No congestion/rhinnorhea. Mouth/Throat: Mucous membranes are moist.  Oropharynx non-erythematous. Neck: No stridor.  No cervical spine tenderness to palpation. Hematological/Lymphatic/Immunilogical: No cervical lymphadenopathy. Cardiovascular: Normal rate, regular rhythm. Grossly normal heart sounds.  Good peripheral circulation. Respiratory: Normal respiratory effort.  No retractions. Lungs CTAB. Gastrointestinal: Soft and nontender. No distention. No abdominal bruits. No CVA tenderness. Genitourinary: Deferred Neurologic:  Normal speech and language. No gross focal neurologic deficits are appreciated. No gait instability. Skin:  Skin is warm, dry and intact. No rash noted. Psychiatric: Mood and affect are normal. Speech and behavior are normal.  ____________________________________________   LABS (all  labs ordered are listed, but only abnormal results are displayed)  Labs Reviewed - No data to display ____________________________________________  EKG   ____________________________________________  RADIOLOGY I, Sable Feil, personally viewed and evaluated these images (plain radiographs) as part of my medical decision making, as well as reviewing the written report by the radiologist.  ED MD interpretation:    Official radiology report(s): No results found.  ____________________________________________   PROCEDURES  Procedure(s) performed (including Critical Care):  Procedures   ____________________________________________   INITIAL IMPRESSION / ASSESSMENT AND PLAN / ED COURSE  As part of my medical decision making, I reviewed the following data within the [place]         Patient presents with itching and tingling in the lips.  Patient suspect allergic reaction to food eaten at a restaurant last night.  Differential consist  of allergic reaction for his anxiety.  No signs of anaphylactic.  Patient given Decadron IM and Atarax prior to departure.  Patient given prescription for Medrol Dosepak and Atarax for 5 days.  Patient advised to have a 3-day blood pressure check through her PCP secondary to elevated readings today of 156/108.  Patient no history of hypertension.  Patient advised follow-up PCP or return right ED if condition worsens.      ____________________________________________   FINAL CLINICAL IMPRESSION(S) / ED DIAGNOSES  Final diagnoses:  Allergic reaction, initial encounter  Elevated blood-pressure reading without diagnosis of hypertension     ED Discharge Orders         Ordered    methylPREDNISolone (MEDROL DOSEPAK) 4 MG TBPK tablet        05/14/20 1128    hydrOXYzine (ATARAX/VISTARIL) 50 MG tablet  3 times daily PRN        05/14/20 1128          *Please note:  Eileen Garcia was evaluated in Emergency Department  on 05/14/2020 for the symptoms described in the history of present illness. She was evaluated in the context of the global COVID-19 pandemic, which necessitated consideration that the patient might be at risk for infection with the SARS-CoV-2 virus that causes COVID-19. Institutional protocols and algorithms that pertain to the evaluation of patients at risk for COVID-19 are in a state of rapid change based on information released by regulatory bodies including the CDC and federal and state organizations. These policies and algorithms were followed during the patient's care in the ED.  Some ED evaluations and interventions may be delayed as a result of limited staffing during and the pandemic.*   Note:  This document was prepared using Dragon voice recognition software and may include unintentional dictation errors.    Sable Feil, PA-C 05/14/20 1138    Merlyn Lot, MD 05/14/20 1259

## 2020-05-14 NOTE — ED Triage Notes (Signed)
Pt states she had a burger last night at [employer] and last time she had one she got itchy but she thought it was from ants- this time she got itchy again and took a benadryl and went to bed- this AM she has had increased trouble swallowing and is very itchy- pt states her lips and tingling and numb- no swelling noted to lips or face

## 2020-05-14 NOTE — ED Triage Notes (Signed)
Pt called for triage, no response. 

## 2020-05-17 DIAGNOSIS — J301 Allergic rhinitis due to pollen: Secondary | ICD-10-CM | POA: Diagnosis not present

## 2020-05-17 DIAGNOSIS — L272 Dermatitis due to ingested food: Secondary | ICD-10-CM | POA: Diagnosis not present

## 2020-05-25 ENCOUNTER — Ambulatory Visit: Payer: BC Managed Care – PPO | Admitting: Dermatology

## 2020-05-25 ENCOUNTER — Other Ambulatory Visit: Payer: Self-pay

## 2020-05-25 DIAGNOSIS — Z86018 Personal history of other benign neoplasm: Secondary | ICD-10-CM | POA: Diagnosis not present

## 2020-05-25 DIAGNOSIS — Z1283 Encounter for screening for malignant neoplasm of skin: Secondary | ICD-10-CM

## 2020-05-25 DIAGNOSIS — D1800 Hemangioma unspecified site: Secondary | ICD-10-CM

## 2020-05-25 DIAGNOSIS — D2262 Melanocytic nevi of left upper limb, including shoulder: Secondary | ICD-10-CM

## 2020-05-25 DIAGNOSIS — D225 Melanocytic nevi of trunk: Secondary | ICD-10-CM

## 2020-05-25 DIAGNOSIS — L578 Other skin changes due to chronic exposure to nonionizing radiation: Secondary | ICD-10-CM

## 2020-05-25 DIAGNOSIS — L814 Other melanin hyperpigmentation: Secondary | ICD-10-CM

## 2020-05-25 DIAGNOSIS — L821 Other seborrheic keratosis: Secondary | ICD-10-CM

## 2020-05-25 DIAGNOSIS — D229 Melanocytic nevi, unspecified: Secondary | ICD-10-CM

## 2020-05-25 NOTE — Progress Notes (Signed)
   Follow-Up Visit   Subjective  Eileen Garcia is a 40 y.o. female who presents for the following: TBSE (Patient here for full body skin exam and skin cancer screening. Patient with hx of dysplastic nevi. She is not aware of any new or changing spots. ).   The following portions of the chart were reviewed this encounter and updated as appropriate:      Review of Systems:  No other skin or systemic complaints except as noted in HPI or Assessment and Plan.  Objective  Well appearing patient in no apparent distress; mood and affect are within normal limits.  A full examination was performed including scalp, head, eyes, ears, nose, lips, neck, chest, axillae, abdomen, back, buttocks, bilateral upper extremities, bilateral lower extremities, hands, feet, fingers, toes, fingernails, and toenails. All findings within normal limits unless otherwise noted below.  Objective  Left Upper Arm, back: 4 x 37mm two toned brown macule, darker inferior L upper arm  1.1cm speckled brown macule at sacrum with no hx of change, congenital nevus  53mm brown papules x 2 at right lower back   Assessment & Plan  Nevus Left Upper Arm, back  Benign-appearing.  Observation.  Call clinic for new or changing lesions.  Recommend daily use of broad spectrum spf 30+ sunscreen to sun-exposed areas.     .Lentigines - Scattered tan macules - Due to sun exposure - Benign-appering, observe - Recommend daily broad spectrum sunscreen SPF 30+ to sun-exposed areas, reapply every 2 hours as needed. - Call for any changes  Seborrheic Keratoses - Stuck-on, waxy, tan-brown papules and/or plaques  - Benign-appearing - Discussed benign etiology and prognosis. - Observe - Call for any changes  Melanocytic Nevi - Tan-brown and/or pink-flesh-colored symmetric macules and papules - Benign appearing on exam today - Observation - Call clinic for new or changing moles - Recommend daily use of broad spectrum spf 30+  sunscreen to sun-exposed areas.   Hemangiomas - Red papules - Discussed benign nature - Observe - Call for any changes  Actinic Damage - Chronic condition, secondary to cumulative UV/sun exposure - diffuse scaly erythematous macules with underlying dyspigmentation - Recommend daily broad spectrum sunscreen SPF 30+ to sun-exposed areas, reapply every 2 hours as needed.  - Staying in the shade or wearing long sleeves, sun glasses (UVA+UVB protection) and wide brim hats (4-inch brim around the entire circumference of the hat) are also recommended for sun protection.  - Call for new or changing lesions.  Skin cancer screening performed today.  History of Dysplastic Nevi - No evidence of recurrence today at right upper arm/excision, left mid upper back lateral/moderate - Recommend regular full body skin exams - Recommend daily broad spectrum sunscreen SPF 30+ to sun-exposed areas, reapply every 2 hours as needed.  - Call if any new or changing lesions are noted between office visits   Return in about 1 year (around 05/25/2021) for TBSE.  Graciella Belton, RMA, am acting as scribe for Brendolyn Patty, MD . Documentation: I have reviewed the above documentation for accuracy and completeness, and I agree with the above.  Brendolyn Patty MD

## 2020-05-25 NOTE — Patient Instructions (Addendum)
Melanoma ABCDEs  Melanoma is the most dangerous type of skin cancer, and is the leading cause of death from skin disease.  You are more likely to develop melanoma if you:  Have light-colored skin, light-colored eyes, or red or blond hair  Spend a lot of time in the sun  Tan regularly, either outdoors or in a tanning bed  Have had blistering sunburns, especially during childhood  Have a close family member who has had a melanoma  Have atypical moles or large birthmarks  Early detection of melanoma is key since treatment is typically straightforward and cure rates are extremely high if we catch it early.   The first sign of melanoma is often a change in a mole or a new dark spot.  The ABCDE system is a way of remembering the signs of melanoma.  A for asymmetry:  The two halves do not match. B for border:  The edges of the growth are irregular. C for color:  A mixture of colors are present instead of an even brown color. D for diameter:  Melanomas are usually (but not always) greater than 53mm - the size of a pencil eraser. E for evolution:  The spot keeps changing in size, shape, and color.  Please check your skin once per month between visits. You can use a small mirror in front and a large mirror behind you to keep an eye on the back side or your body.   If you see any new or changing lesions before your next follow-up, please call to schedule a visit.  Please continue daily skin protection including broad spectrum sunscreen SPF 30+ to sun-exposed areas, reapplying every 2 hours as needed when you're outdoors.    If you have any questions or concerns for your doctor, please call our main line at [phone] and press option 4 to reach your doctor's medical assistant. If no one answers, please leave a voicemail as directed and we will return your call as soon as possible. Messages left after 4 pm will be answered the following business day.   You may also send [place] a message via  [place]. We typically respond to MyChart messages within 1-2 business days.  For prescription refills, please ask your pharmacy to contact our office. Our fax number is [phone].  If you have an urgent issue when the clinic is closed that cannot wait until the next business day, you can page your doctor at the number below.    Please note that while we do our best to be available for urgent issues outside of office hours, we are not available 24/7.   If you have an urgent issue and are unable to reach [place], you may choose to seek medical care at your doctor's office, retail clinic, urgent care center, or emergency room.  If you have a medical emergency, please immediately call 911 or go to the emergency department.  Pager Numbers  - Dr. Nehemiah Massed: [phone]  - Dr. Laurence Ferrari: [phone]  - Dr. Nicole Kindred: [phone]  In the event of inclement weather, please call our main line at [phone] for an update on the status of any delays or closures.  Dermatology Medication Tips: Please keep the boxes that topical medications come in in order to help keep track of the instructions about where and how to use these. Pharmacies typically print the medication instructions only on the boxes and not directly on the medication tubes.   If your medication is too expensive, please contact our office  at [phone] option 4 or send [place] a message through [place].   We are unable to tell what your co-pay for medications will be in advance as this is different depending on your insurance coverage. However, we may be able to find a substitute medication at lower cost or fill out paperwork to get insurance to cover a needed medication.   If a prior authorization is required to get your medication covered by your insurance company, please allow [place] 1-2 business days to complete this process.  Drug prices often vary depending on where the prescription is filled and some pharmacies may offer cheaper  prices.  The website www.goodrx.com contains coupons for medications through different pharmacies. The prices here do not account for what the cost may be with help from insurance (it may be cheaper with your insurance), but the website can give you the price if you did not use any insurance.  - You can print the associated coupon and take it with your prescription to the pharmacy.  - You may also stop by our office during regular business hours and pick up a GoodRx coupon card.  - If you need your prescription sent electronically to a different pharmacy, notify our office through [facility] or by phone at [phone] option 4.  Recommend daily broad spectrum sunscreen SPF 30+ to sun-exposed areas, reapply every 2 hours as needed. Call for new or changing lesions.  Staying in the shade or wearing long sleeves, sun glasses (UVA+UVB protection) and wide brim hats (4-inch brim around the entire circumference of the hat) are also recommended for sun protection.

## 2020-06-09 NOTE — Progress Notes (Signed)
PCP:  [name], MD   Chief Complaint  Patient presents with  . Gynecologic Exam    No concerns     HPI:      [name] is a 40 y.o. G4P0040 who LMP was Patient's last menstrual period was 05/16/2020 (approximate)., presents today for her annual examination.  Her menses are monthly with Kyleena, spotting for 1-2 wks usually (recently 2-3 wks since increased stress with family starting 1/22). No BTB, mild dysmen, no meds needed. Had normal GYN u/s 3/21 with IUD in correct location. Did POPs in past for better cycle control with IUD, but stopped a yr or so ago and would prefer not to be on them.  Skyla placed 2017 due to anovulatory bleeding/menorrhagia with Dr. Laurey Morale. Suspicion of endometriosis given hx of pelvic pain. Failed mult OCPs, ortho evra and depo. Hx of migraines with aura.  Sex activity: single partner, contraception - IUD. Kyleena placed 07/08/18. Pt with long hx of dyspareunia. Last Pap: 02/11/18  Results were: no abnormalities /neg HPV DNA  Hx of STDs: none  There is no FH of breast cancer. There is a FH of uterine vs ovarian cancer in PGM (no chemo/rad done), genetic testing not done. The patient does do self-breast exams.  Tobacco use: 1/3-1/2 ppd; not ready to quit Alcohol use: none No drug use.  Exercise: moderately active  She does get adequate calcium and Vitamin D in her diet.   Past Medical History:  Diagnosis Date  . Amenorrhea   . Anxiety   . Asthma    allergy induced  . Atypical mole 12/17/2017   Atypical combined nevus, Right upper arm, excision  . Complication of anesthesia    pt states she was given sedation prior to her nasal surgery in preop, then pt woke up on her way back to [place] pts surgery pt was very agitated and yelling   . Dysplastic nevus 11/2017   L mid upper back lateral , moderate  . Family history of adverse reaction to anesthesia    dad-vomits and very emotional after surgery  . GERD (gastroesophageal reflux disease)     no meds  . Headache   . History of kidney stones 04/2018  . Lumbar back pain 07/23/2018  . Menorrhagia   . Migraine with aura   . Missed abortion   . Pelvic pain   . Polycystic ovaries   . PONV (postoperative nausea and vomiting)     Past Surgical History:  Procedure Laterality Date  . ACHILLES TENDON SURGERY Left 07/26/2018   Procedure: ACHILLES TENDON REPAIR SECONDARY;  Surgeon: Samara Deist, DPM;  Location: [facility] ORS;  Service: Podiatry;  Laterality: Left;  . NASAL SEPTUM SURGERY  2012-2013   with Dr Kathyrn Sheriff  . TYMPANOSTOMY TUBE PLACEMENT     3-4 different surgeries  . WISDOM TOOTH EXTRACTION      Family History  Problem Relation Age of Onset  . Hypertension Father   . Migraines Paternal Grandmother   . Diabetes Paternal Grandmother        type 1  . Hypertension Paternal Grandmother   . Uterine cancer Paternal Grandmother 60       vs ovar--no chemo/rad done    Social History   Socioeconomic History  . Marital status: Married    Spouse name: Not on file  . Number of children: Not on file  . Years of education: Not on file  . Highest education level: Not on file  Occupational History  .  Not on file  Tobacco Use  . Smoking status: Former Smoker    Packs/day: 0.50    Years: 10.00    Pack years: 5.00    Types: Cigarettes    Quit date: 07/22/2008    Years since quitting: 11.8  . Smokeless tobacco: Never Used  Vaping Use  . Vaping Use: Never used  Substance and Sexual Activity  . Alcohol use: Yes    Comment: rare  . Drug use: No  . Sexual activity: Yes    Birth control/protection: I.U.D.    Comment: Kyleena  Other Topics Concern  . Not on file  Social History Narrative  . Not on file   Social Determinants of Health   Financial Resource Strain: Not on file  Food Insecurity: Not on file  Transportation Needs: Not on file  Physical Activity: Not on file  Stress: Not on file  Social Connections: Not on file  Intimate Partner Violence: Not on file     Current Meds  Medication Sig  . AIMOVIG 70 MG/ML SOAJ Inject 70 mg into the skin every 30 (thirty) days.   . cetirizine (ZYRTEC) 10 MG tablet Take 10 mg by mouth at bedtime.      ROS:  Review of Systems  Constitutional: Negative for fatigue, fever and unexpected weight change.  Respiratory: Negative for cough, shortness of breath and wheezing.   Cardiovascular: Negative for chest pain, palpitations and leg swelling.  Gastrointestinal: Negative for blood in stool, constipation, diarrhea, nausea and vomiting.  Endocrine: Negative for cold intolerance, heat intolerance and polyuria.  Genitourinary: Negative for dyspareunia, dysuria, flank pain, frequency, genital sores, hematuria, menstrual problem, pelvic pain, urgency, vaginal bleeding, vaginal discharge and vaginal pain.  Musculoskeletal: Negative for back pain, joint swelling and myalgias.  Skin: Negative for rash.  Neurological: Negative for dizziness, syncope, light-headedness, numbness and headaches.  Hematological: Negative for adenopathy.  Psychiatric/Behavioral: Negative for agitation, confusion, sleep disturbance and suicidal ideas. The patient is not nervous/anxious.      Objective: BP 118/80   Ht 5\' 1"  (1.549 m)   Wt 195 lb (88.5 kg)   LMP 05/16/2020 (Approximate)   BMI 36.84 kg/m   Physical Exam Constitutional:      Appearance: She is well-developed.  Genitourinary:     Vulva normal.     Right Labia: No rash, tenderness or lesions.    Left Labia: No tenderness, lesions or rash.    No vaginal discharge, erythema or tenderness.      Right Adnexa: not tender and no mass present.    Left Adnexa: not tender and no mass present.    No cervical friability or polyp.     IUD strings visualized.     Uterus is not enlarged or tender.  Breasts:     Right: No mass, nipple discharge, skin change or tenderness.     Left: No mass, nipple discharge, skin change or tenderness.    Neck:     Thyroid: No thyromegaly.   Cardiovascular:     Rate and Rhythm: Normal rate and regular rhythm.     Heart sounds: Normal heart sounds. No murmur heard.   Pulmonary:     Effort: Pulmonary effort is normal.     Breath sounds: Normal breath sounds.  Abdominal:     Palpations: Abdomen is soft.     Tenderness: There is no abdominal tenderness. There is no guarding or rebound.  Musculoskeletal:        General: Normal range of motion.  Cervical back: Normal range of motion.  Lymphadenopathy:     Cervical: No cervical adenopathy.  Neurological:     General: No focal deficit present.     Mental Status: She is alert and oriented to person, place, and time.     Cranial Nerves: No cranial nerve deficit.  Skin:    General: Skin is warm and dry.  Psychiatric:        Mood and Affect: Mood normal.        Behavior: Behavior normal.        Thought Content: Thought content normal.        Judgment: Judgment normal.  Vitals reviewed.     Assessment/Plan: Encounter for annual routine gynecological examination  Encounter for routine checking of intrauterine contraceptive device (IUD); IUD strings in cx os; due for removal 6/25. Long menses with IUD but increased recently due to extra stress. Pt declines POPs. F/u prn.     GYN counsel adequate intake of calcium and vitamin D, diet and exercise     F/U  Return in about 1 year (around 06/10/2021).  Eileen Garcia B. Atanacio Melnyk, PA-C 06/10/2020 10:55 AM

## 2020-06-10 ENCOUNTER — Encounter: Payer: Self-pay | Admitting: Obstetrics and Gynecology

## 2020-06-10 ENCOUNTER — Ambulatory Visit (INDEPENDENT_AMBULATORY_CARE_PROVIDER_SITE_OTHER): Payer: BC Managed Care – PPO | Admitting: Obstetrics and Gynecology

## 2020-06-10 ENCOUNTER — Other Ambulatory Visit: Payer: Self-pay

## 2020-06-10 VITALS — BP 118/80 | Ht 61.0 in | Wt 195.0 lb

## 2020-06-10 DIAGNOSIS — Z30431 Encounter for routine checking of intrauterine contraceptive device: Secondary | ICD-10-CM

## 2020-06-10 DIAGNOSIS — Z01419 Encounter for gynecological examination (general) (routine) without abnormal findings: Secondary | ICD-10-CM | POA: Diagnosis not present

## 2020-06-10 DIAGNOSIS — N921 Excessive and frequent menstruation with irregular cycle: Secondary | ICD-10-CM

## 2020-06-10 NOTE — Patient Instructions (Signed)
I value your feedback and you entrusting us with your care. If you get a [place] patient survey, I would appreciate you taking the time to let us know about your experience today. Thank you! ? ? ?

## 2020-06-21 DIAGNOSIS — M542 Cervicalgia: Secondary | ICD-10-CM | POA: Diagnosis not present

## 2020-06-21 DIAGNOSIS — G43719 Chronic migraine without aura, intractable, without status migrainosus: Secondary | ICD-10-CM | POA: Diagnosis not present

## 2020-06-21 DIAGNOSIS — G43839 Menstrual migraine, intractable, without status migrainosus: Secondary | ICD-10-CM | POA: Diagnosis not present

## 2020-10-04 ENCOUNTER — Encounter: Payer: Self-pay | Admitting: Obstetrics and Gynecology

## 2020-10-05 MED ORDER — NORETHINDRONE 0.35 MG PO TABS: 1.0000 | ORAL_TABLET | Freq: Every day | ORAL | 2 refills | Status: DC

## 2020-10-06 ENCOUNTER — Other Ambulatory Visit: Payer: Self-pay

## 2020-10-06 ENCOUNTER — Ambulatory Visit: Payer: BC Managed Care – PPO | Admitting: Dermatology

## 2020-10-06 DIAGNOSIS — L811 Chloasma: Secondary | ICD-10-CM

## 2020-10-06 NOTE — Progress Notes (Signed)
   Follow-Up Visit   Subjective  Eileen Garcia is a 40 y.o. female who presents for the following: Skin Problem (Check spots on forearms. Dur: 1 week. Discoloration. Asymptomatic. ). Has been out in sun more this summer at soccer games.  Just started on BCP yesterday for PCOS.    The following portions of the chart were reviewed this encounter and updated as appropriate:      Review of Systems: No other skin or systemic complaints except as noted in HPI or Assessment and Plan.   Objective  Well appearing patient in no apparent distress; mood and affect are within normal limits.  A focused examination was performed including face and bilateral forearms. Relevant physical exam findings are noted in the Assessment and Plan.  bilateral forearms Reticulated hyperpigmented patches extensor forearms. Face clear  Assessment & Plan  Melasma bilateral forearms  Melasma is a chronic condition of persistent pigmented patches generally on the face, worse in summer due to higher UV exposure.  Oral estrogen containing BCPs or supplements can exacerbate condition.  Recommend daily broad spectrum tinted sunscreen SPF 30+ to face, preferably with Zinc or Titanium Dioxide. Discussed Rx topical bleaching creams (i.e. hydroquinone), OTC HelioCare supplement, chemical peels (would need multiple for best result).   Pt declines Rx fade cream at this time Recommend daily sunscreen spf 30+ with zinc and/or photoprotection  Return if symptoms worsen or fail to improve.  I, Emelia Salisbury, CMA, am acting as scribe for Brendolyn Patty, MD.  Documentation: I have reviewed the above documentation for accuracy and completeness, and I agree with the above.  Brendolyn Patty MD

## 2020-10-06 NOTE — Patient Instructions (Addendum)
Recommend daily broad spectrum sunscreen SPF 30+ to sun-exposed areas, reapply every 2 hours as needed. Call for new or changing lesions.  Staying in the shade or wearing long sleeves, sun glasses (UVA+UVB protection) and wide brim hats (4-inch brim around the entire circumference of the hat) are also recommended for sun protection.   Recommend sunscreen with zinc.   Melasma is a chronic condition of persistent pigmented patches generally on the face, worse in summer due to higher UV exposure.  Oral estrogen containing BCPs or supplements can exacerbate condition.  Recommend daily broad spectrum tinted sunscreen SPF 30+ to face, preferably with Zinc or Titanium Dioxide. Discussed Rx topical bleaching creams (i.e. hydroquinone), OTC HelioCare supplement, chemical peels (would need multiple for best result).    Recommend taking Heliocare sun protection supplement daily in sunny weather for additional sun protection. For maximum protection on the sunniest days, you can take up to 2 capsules of regular Heliocare OR take 1 capsule of Heliocare Ultra. For prolonged exposure (such as a full day in the sun), you can repeat your dose of the supplement 4 hours after your first dose. Heliocare can be purchased at [facility] or at [email or web address].    If you have any questions or concerns for your doctor, please call our main line at [phone] and press option 4 to reach your doctor's medical assistant. If no one answers, please leave a voicemail as directed and we will return your call as soon as possible. Messages left after 4 pm will be answered the following business day.   You may also send [place] a message via [facility]. We typically respond to MyChart messages within 1-2 business days.  For prescription refills, please ask your pharmacy to contact our office. Our fax number is [phone].  If you have an urgent issue when the clinic is closed that cannot wait until the next business day, you can  page your doctor at the number below.    Please note that while we do our best to be available for urgent issues outside of office hours, we are not available 24/7.   If you have an urgent issue and are unable to reach [place], you may choose to seek medical care at your doctor's office, retail clinic, urgent care center, or emergency room.  If you have a medical emergency, please immediately call 911 or go to the emergency department.  Pager Numbers  - Dr. Nehemiah Massed: [phone]  - Dr. Laurence Ferrari: [phone]  - Dr. Nicole Kindred: [phone]  In the event of inclement weather, please call our main line at [phone] for an update on the status of any delays or closures.  Dermatology Medication Tips: Please keep the boxes that topical medications come in in order to help keep track of the instructions about where and how to use these. Pharmacies typically print the medication instructions only on the boxes and not directly on the medication tubes.   If your medication is too expensive, please contact our office at [phone] option 4 or send [place] a message through [place].   We are unable to tell what your co-pay for medications will be in advance as this is different depending on your insurance coverage. However, we may be able to find a substitute medication at lower cost or fill out paperwork to get insurance to cover a needed medication.   If a prior authorization is required to get your medication covered by your insurance company, please allow [place] 1-2 business days to complete this process.  Drug prices often vary depending on where the prescription is filled and some pharmacies may offer cheaper prices.  The website www.goodrx.com contains coupons for medications through different pharmacies. The prices here do not account for what the cost may be with help from insurance (it may be cheaper with your insurance), but the website can give you the price if you did not use any insurance.  - You  can print the associated coupon and take it with your prescription to the pharmacy.  - You may also stop by our office during regular business hours and pick up a GoodRx coupon card.  - If you need your prescription sent electronically to a different pharmacy, notify our office through [facility] or by phone at [phone] option 4.

## 2021-02-03 DIAGNOSIS — J0100 Acute maxillary sinusitis, unspecified: Secondary | ICD-10-CM | POA: Diagnosis not present

## 2021-03-13 DIAGNOSIS — J4521 Mild intermittent asthma with (acute) exacerbation: Secondary | ICD-10-CM | POA: Diagnosis not present

## 2021-03-13 DIAGNOSIS — J0100 Acute maxillary sinusitis, unspecified: Secondary | ICD-10-CM | POA: Diagnosis not present

## 2021-03-13 DIAGNOSIS — H66003 Acute suppurative otitis media without spontaneous rupture of ear drum, bilateral: Secondary | ICD-10-CM | POA: Diagnosis not present

## 2021-04-27 ENCOUNTER — Encounter: Payer: Self-pay | Admitting: Dermatology

## 2021-05-16 ENCOUNTER — Encounter: Payer: Self-pay | Admitting: Obstetrics and Gynecology

## 2021-05-17 ENCOUNTER — Other Ambulatory Visit: Payer: Self-pay | Admitting: Obstetrics and Gynecology

## 2021-05-17 MED ORDER — NORETHINDRONE 0.35 MG PO TABS: 1.0000 | ORAL_TABLET | Freq: Every day | ORAL | 0 refills | Status: DC

## 2021-05-17 NOTE — Progress Notes (Signed)
Rx POPs for bleeding with IUD. Did in past. Pt going on vacation.  ?

## 2021-05-25 ENCOUNTER — Encounter: Payer: Self-pay | Admitting: Obstetrics and Gynecology

## 2021-05-25 ENCOUNTER — Ambulatory Visit: Payer: BC Managed Care – PPO | Admitting: Obstetrics and Gynecology

## 2021-05-25 VITALS — BP 110/70 | Ht 61.0 in | Wt 189.0 lb

## 2021-05-25 DIAGNOSIS — N6320 Unspecified lump in the left breast, unspecified quadrant: Secondary | ICD-10-CM | POA: Diagnosis not present

## 2021-05-25 DIAGNOSIS — N644 Mastodynia: Secondary | ICD-10-CM

## 2021-05-25 NOTE — Progress Notes (Signed)
? ? ?Pcp, No ? ? ?Chief Complaint  ?Patient presents with  ? Breast Exam  ?  Swelling and pain on LB only  ? ? ?HPI: ?     [name] is a 41 y.o. G4P0040 whose LMP was No LMP recorded. (Menstrual status: IUD)., presents today for LT breast swelling and pain for the past wk. LT outer quadrant is tender to touch but no pain if just sitting there. Radiates upward at times; feels like breast is swollen and larger than usual/larger now that RT breast. No trauma, erythema, nipple d/c. No masses, pt does SBE. Drinks caffeine daily. Hx of dry cracked nipples in her 43s but no other breast masses/issues. S/p breast u/s at [facility] in her 37s. FH breast cancer unknown on mat side.  ?Has Sellers IUD for menorrhagia/pelvic pain.  ? ?Patient Active Problem List  ? Diagnosis Date Noted  ? History of multiple miscarriages 02/11/2018  ? PCOS (polycystic ovarian syndrome) 12/14/2016  ? Menometrorrhagia 10/12/2016  ? ? ?Past Surgical History:  ?Procedure Laterality Date  ? ACHILLES TENDON SURGERY Left 07/26/2018  ? Procedure: ACHILLES TENDON REPAIR SECONDARY;  Surgeon: Samara Deist, DPM;  Location: [facility] ORS;  Service: Podiatry;  Laterality: Left;  ? NASAL SEPTUM SURGERY  2012-2013  ? with Dr Kathyrn Sheriff  ? TYMPANOSTOMY TUBE PLACEMENT    ? 3-4 different surgeries  ? WISDOM TOOTH EXTRACTION    ? ? ?Family History  ?Problem Relation Age of Onset  ? Hypertension Father   ? Migraines Paternal Grandmother   ? Diabetes Paternal Grandmother   ?     type 1  ? Hypertension Paternal Grandmother   ? Uterine cancer Paternal Grandmother 4  ?     vs ovar--no chemo/rad done  ? ? ?Social History  ? ?Socioeconomic History  ? Marital status: Married  ?  Spouse name: Not on file  ? Number of children: Not on file  ? Years of education: Not on file  ? Highest education level: Not on file  ?Occupational History  ? Not on file  ?Tobacco Use  ? Smoking status: Former  ?  Packs/day: 0.50  ?  Years: 10.00  ?  Pack years: 5.00  ?  Types: Cigarettes  ?   Quit date: 07/22/2008  ?  Years since quitting: 12.8  ? Smokeless tobacco: Never  ?Vaping Use  ? Vaping Use: Never used  ?Substance and Sexual Activity  ? Alcohol use: Yes  ?  Comment: rare  ? Drug use: No  ? Sexual activity: Yes  ?  Birth control/protection: I.U.D.  ?  Comment: Kyleena  ?Other Topics Concern  ? Not on file  ?Social History Narrative  ? Not on file  ? ?Social Determinants of Health  ? ?Financial Resource Strain: Not on file  ?Food Insecurity: Not on file  ?Transportation Needs: Not on file  ?Physical Activity: Not on file  ?Stress: Not on file  ?Social Connections: Not on file  ?Intimate Partner Violence: Not on file  ? ? ?Outpatient Medications Prior to Visit  ?Medication Sig Dispense Refill  ? cetirizine (ZYRTEC) 10 MG tablet Take 10 mg by mouth at bedtime.     ? Levonorgestrel (KYLEENA) 19.5 MG IUD 1 each (19.5 mg total) by Intrauterine route once for 1 dose. 1 each 0  ? AIMOVIG 70 MG/ML SOAJ Inject 70 mg into the skin every 30 (thirty) days.  (Patient not taking: Reported on 10/06/2020)    ? norethindrone (MICRONOR) 0.35 MG tablet  Take 1 tablet (0.35 mg total) by mouth daily. 28 tablet 0  ? ?No facility-administered medications prior to visit.  ? ? ? ? ?ROS: ? ?Review of Systems  ?Constitutional:  Negative for fever.  ?Gastrointestinal:  Negative for blood in stool, constipation, diarrhea, nausea and vomiting.  ?Genitourinary:  Negative for dyspareunia, dysuria, flank pain, frequency, hematuria, urgency, vaginal bleeding, vaginal discharge and vaginal pain.  ?Musculoskeletal:  Negative for back pain.  ?Skin:  Negative for rash.  ?BREAST: pain/swelling ? ? ?OBJECTIVE:  ? ?Vitals:  ?BP 110/70   Ht '5\' 1"'$  (1.549 m)   Wt 189 lb (85.7 kg)   BMI 35.71 kg/m?  ? ?Physical Exam ?Vitals reviewed.  ?Pulmonary:  ?   Effort: Pulmonary effort is normal.  ?Chest:  ?Breasts: ?   Breasts are symmetrical.  ?   Right: No inverted nipple, mass, nipple discharge, skin change or tenderness.  ?   Left: Mass and  tenderness present. No inverted nipple, nipple discharge or skin change.  ? ? ?Musculoskeletal:     ?   General: Normal range of motion.  ?   Cervical back: Normal range of motion.  ?Skin: ?   General: Skin is warm and dry.  ?Neurological:  ?   General: No focal deficit present.  ?   Mental Status: She is alert and oriented to person, place, and time.  ?   Cranial Nerves: No cranial nerve deficit.  ?Psychiatric:     ?   Mood and Affect: Mood normal.     ?   Behavior: Behavior normal.     ?   Thought Content: Thought content normal.     ?   Judgment: Judgment normal.  ? ? ?Assessment/Plan: ?Breast pain, left - Plan: US BREAST LTD UNI RIGHT INC AXILLA, US BREAST LTD UNI LEFT INC AXILLA, MM DIAG BREAST TOMO BILATERAL; for 1 wk; possible LT breast mass; check dx mammo and u/s. Pt to schedule. Will f/u with results. NSAIDs/heating pad or ice prn. D/C caffeine.  ? ?Mass of left breast, unspecified quadrant - Plan: US BREAST LTD UNI RIGHT INC AXILLA, US BREAST LTD UNI LEFT INC AXILLA, MM DIAG BREAST TOMO BILATERAL ? ? ? Return if symptoms worsen or fail to improve. ? ?Brandilyn Nanninga B. Malayna Noori, PA-C ?05/25/2021 ?10:14 AM ? ? ? ? ? ?

## 2021-05-25 NOTE — Patient Instructions (Signed)
I value your feedback and you entrusting us with your care. If you get a [place] patient survey, I would appreciate you taking the time to let us know about your experience today. Thank you! ? ? ?

## 2021-05-31 ENCOUNTER — Ambulatory Visit: Payer: BC Managed Care – PPO | Admitting: Dermatology

## 2021-05-31 DIAGNOSIS — Z86018 Personal history of other benign neoplasm: Secondary | ICD-10-CM

## 2021-05-31 DIAGNOSIS — L578 Other skin changes due to chronic exposure to nonionizing radiation: Secondary | ICD-10-CM

## 2021-05-31 DIAGNOSIS — D225 Melanocytic nevi of trunk: Secondary | ICD-10-CM | POA: Diagnosis not present

## 2021-05-31 DIAGNOSIS — D2262 Melanocytic nevi of left upper limb, including shoulder: Secondary | ICD-10-CM | POA: Diagnosis not present

## 2021-05-31 DIAGNOSIS — L858 Other specified epidermal thickening: Secondary | ICD-10-CM

## 2021-05-31 DIAGNOSIS — D235 Other benign neoplasm of skin of trunk: Secondary | ICD-10-CM | POA: Diagnosis not present

## 2021-05-31 DIAGNOSIS — L814 Other melanin hyperpigmentation: Secondary | ICD-10-CM

## 2021-05-31 DIAGNOSIS — Q825 Congenital non-neoplastic nevus: Secondary | ICD-10-CM

## 2021-05-31 DIAGNOSIS — B070 Plantar wart: Secondary | ICD-10-CM

## 2021-05-31 DIAGNOSIS — Z1283 Encounter for screening for malignant neoplasm of skin: Secondary | ICD-10-CM | POA: Diagnosis not present

## 2021-05-31 DIAGNOSIS — D229 Melanocytic nevi, unspecified: Secondary | ICD-10-CM

## 2021-05-31 DIAGNOSIS — L821 Other seborrheic keratosis: Secondary | ICD-10-CM

## 2021-05-31 DIAGNOSIS — D1800 Hemangioma unspecified site: Secondary | ICD-10-CM

## 2021-05-31 NOTE — Progress Notes (Signed)
? ?Follow-Up Visit ?  ?Subjective  ?Eileen Garcia is a 41 y.o. female who presents for the following: Annual Exam. ? ?The patient presents for Total-Body Skin Exam (TBSE) for skin cancer screening and mole check.  The patient has spots, moles and lesions to be evaluated, some may be new or changing. She has a history of dysplastic nevi. She has a spot on the right plantar foot for > 1 year that is bothersome.  ? ?  ?The following portions of the chart were reviewed this encounter and updated as appropriate:  ?  ?  ? ?Review of Systems:  No other skin or systemic complaints except as noted in HPI or Assessment and Plan. ? ?Objective  ?Well appearing patient in no apparent distress; mood and affect are within normal limits. ? ?A full examination was performed including scalp, head, eyes, ears, nose, lips, neck, chest, axillae, abdomen, back, buttocks, bilateral upper extremities, bilateral lower extremities, hands, feet, fingers, toes, fingernails, and toenails. All findings within normal limits unless otherwise noted below. ? ?Left Upper Arm, R lower back ?4 x 63m two toned brown macule, darker inferior L upper arm ?   ?728mbrown papules x 2 at right lower back ?  ? ? ? ? ? ? ? ? ?sacrum ?1.1cm speckled brown macule at sacrum, present for years with no change per pt ? ? ? ? ? ? ?Left Mid Back ?2.39m13mrown macule within round white scar ? ? ? ? ? ? ? ?Right lateral plantar foot at ball ?Firm verrucous papule  ? ? ? ?Assessment & Plan  ?Nevus ?Left Upper Arm, R lower back ? ?Benign-appearing.  Stable. Observation.  Call clinic for new or changing moles.  Recommend daily use of broad spectrum spf 30+ sunscreen to sun-exposed areas.  ? ?Congenital non-neoplastic nevus ?sacrum ? ?Benign-appearing.  Stable. Observation.  Call clinic for new or changing moles.  Recommend daily use of broad spectrum spf 30+ sunscreen to sun-exposed areas.  ? ? ?Benign nevus ?Left Mid Back ? ?Biopsy proven, 2019. With recurrence.  Observation.  ? ?Plantar wart ?Right lateral plantar foot at ball ? ?Discussed viral etiology and risk of spread.  Discussed multiple treatments may be required to clear warts.  Discussed possible post-treatment dyspigmentation and risk of recurrence. ? ?Destruction of lesion - Right lateral plantar foot at ball ? ?Destruction method: cryotherapy   ?Destruction method comment:  Paring performed prior to cryotherapy. ?Informed consent: discussed and consent obtained   ?Lesion destroyed using liquid nitrogen: Yes   ?Region frozen until ice ball extended beyond lesion: Yes   ?Outcome: patient tolerated procedure well with no complications   ?Post-procedure details: wound care instructions given   ?Additional details:  Prior to procedure, discussed risks of blister formation, small wound, skin dyspigmentation, or rare scar following cryotherapy. Recommend Vaseline ointment to treated areas while healing. ? ? ?Destruction of lesion - Right lateral plantar foot at ball ? ?Destruction method: chemical removal   ?Informed consent: discussed and consent obtained   ?Timeout:  patient name, date of birth, surgical site, and procedure verified ?Chemical destruction method: cantharidin   ?Application time:  6 hours ?Procedure instructions: patient instructed to wash and dry area   ?Outcome: patient tolerated procedure well with no complications   ?Post-procedure details: wound care instructions given   ?Additional details:  Cantharidin Plus is a blistering agent that comes from a beetle.  It needs to be washed off in about 4-6 hours after application.  Although  it is painless when applied in office, it may cause symptoms of mild pain and burning several hours later.  Treated areas will swell and turn red, and blisters may form.  Vaseline and a bandaid may be applied until wound has healed.  Once healed, the skin may remain temporarily discolored.  It can take weeks to months for pigmentation to return to normal.  Advised to wash  off with soap and water in 4-6 hours or sooner if it becomes tender before then.  ? ?Skin cancer screening performed today. ? ?Actinic Damage ?- chronic, secondary to cumulative UV radiation exposure/sun exposure over time ?- diffuse scaly erythematous macules with underlying dyspigmentation ?- Recommend daily broad spectrum sunscreen SPF 30+ to sun-exposed areas, reapply every 2 hours as needed.  ?- Recommend staying in the shade or wearing long sleeves, sun glasses (UVA+UVB protection) and wide brim hats (4-inch brim around the entire circumference of the hat). ?- Call for new or changing lesions. ? ?Lentigines ?- Scattered tan macules ?- Due to sun exposure ?- Benign-appering, observe ?- Recommend daily broad spectrum sunscreen SPF 30+ to sun-exposed areas, reapply every 2 hours as needed. ?- Call for any changes ? ?Seborrheic Keratoses ?- Stuck-on, waxy, tan-brown papules and/or plaques, including waxy flesh papule on the left lateral cheek at jaw ?- Benign-appearing ?- Discussed benign etiology and prognosis. ?- Observe ?- Call for any changes ? ?Keratosis Pilaris ?- Tiny follicular keratotic papules ?- Benign. Genetic in nature. No cure. ?- Observe. ?- If desired, patient can use an emollient (moisturizer) containing ammonium lactate, urea or salicylic acid once a day to smooth the area ? ?Melanocytic Nevi ?- Tan-brown and/or pink-flesh-colored symmetric macules and papules ?- Benign appearing on exam today ?- Observation ?- Call clinic for new or changing moles ?- Recommend daily use of broad spectrum spf 30+ sunscreen to sun-exposed areas.  ? ?Hemangiomas ?- Red papules ?- Discussed benign nature ?- Observe ?- Call for any changes ? ?History of Dysplastic Nevus/Atypical Nevus ?- No evidence of recurrence today ?- Recommend regular full body skin exams ?- Recommend daily broad spectrum sunscreen SPF 30+ to sun-exposed areas, reapply every 2 hours as needed.  ?- Call if any new or changing lesions are noted  between office visits ? ? ?Return in about 1 month (around 06/30/2021) for wart. ? ?I, Jamesetta Orleans, CMA, am acting as scribe for Brendolyn Patty, MD . ? ?Documentation: I have reviewed the above documentation for accuracy and completeness, and I agree with the above. ? ?Brendolyn Patty MD  ?

## 2021-05-31 NOTE — Patient Instructions (Addendum)
Viral Warts & Molluscum Contagiosum ? ?Viral warts and molluscum contagiosum are growths of the skin caused by viral infection of the skin. If you have been given the diagnosis of viral warts or molluscum contagiosum there are a few things that you must understand about your condition: ? ?There is no guaranteed treatment method available for this condition. ?Multiple treatments may be required, ?The treatments may be time consuming and require multiple visits to the dermatology office. ?The treatment may be expensive. You will be charged each time you come into the office to have the spots treated. ?The treated areas may develop new lesions further complicating treatment. ?The treated areas may leave a scar. ?There is no guarantee that even after multiple treatments that the spots will be successfully treated. ?These are caused by a viral infection and can be spread to other areas of the skin and to other people by direct contact. Therefore, new spots may occur. ? ? ?Instructions for After In-Office Application of Cantharidin (on wart) ? ?1. This is a strong medicine; please follow ALL instructions. ? ?2. Gently wash off with soap and water in four hours or sooner s directed by your physician. ? ?3. **WARNING** this medicine can cause severe blistering, blood blisters, infection, and/or scarring if it is not washed off as directed. ? ?4. Your progress will be rechecked in 1-2 months; call sooner if there are any questions or problems. ? ?Melanoma ABCDEs ? ?Melanoma is the most dangerous type of skin cancer, and is the leading cause of death from skin disease.  You are more likely to develop melanoma if you: ?Have light-colored skin, light-colored eyes, or red or blond hair ?Spend a lot of time in the sun ?Tan regularly, either outdoors or in a tanning bed ?Have had blistering sunburns, especially during childhood ?Have a close family member who has had a melanoma ?Have atypical moles or large birthmarks ? ?Early  detection of melanoma is key since treatment is typically straightforward and cure rates are extremely high if we catch it early.  ? ?The first sign of melanoma is often a change in a mole or a new dark spot.  The ABCDE system is a way of remembering the signs of melanoma. ? ?A for asymmetry:  The two halves do not match. ?B for border:  The edges of the growth are irregular. ?C for color:  A mixture of colors are present instead of an even brown color. ?D for diameter:  Melanomas are usually (but not always) greater than 21m - the size of a pencil eraser. ?E for evolution:  The spot keeps changing in size, shape, and color. ? ?Please check your skin once per month between visits. You can use a small mirror in front and a large mirror behind you to keep an eye on the back side or your body.  ? ?If you see any new or changing lesions before your next follow-up, please call to schedule a visit. ? ?Please continue daily skin protection including broad spectrum sunscreen SPF 30+ to sun-exposed areas, reapplying every 2 hours as needed when you're outdoors.  ? ?Staying in the shade or wearing long sleeves, sun glasses (UVA+UVB protection) and wide brim hats (4-inch brim around the entire circumference of the hat) are also recommended for sun protection.   ? ? ?Seborrheic Keratosis ? ?What causes seborrheic keratoses? ?Seborrheic keratoses are harmless, common skin growths that first appear during adult life.  As time goes by, more growths appear.  Some people  may develop a large number of them.  Seborrheic keratoses appear on both covered and uncovered body parts.  They are not caused by sunlight.  The tendency to develop seborrheic keratoses can be inherited.  They vary in color from skin-colored to gray, brown, or even black.  They can be either smooth or have a rough, warty surface.   ?Seborrheic keratoses are superficial and look as if they were stuck on the skin.  Under the microscope this type of keratosis looks  like layers upon layers of skin.  That is why at times the top layer may seem to fall off, but the rest of the growth remains and re-grows.   ? ?Treatment ?Seborrheic keratoses do not need to be treated, but can easily be removed in the office.  Seborrheic keratoses often cause symptoms when they rub on clothing or jewelry.  Lesions can be in the way of shaving.  If they become inflamed, they can cause itching, soreness, or burning.  Removal of a seborrheic keratosis can be accomplished by freezing, burning, or surgery. ?If any spot bleeds, scabs, or grows rapidly, please return to have it checked, as these can be an indication of a skin cancer. ? ? ? ?If You Need Anything After Your Visit ? ?If you have any questions or concerns for your doctor, please call our main line at [phone] and press option 4 to reach your doctor's medical assistant. If no one answers, please leave a voicemail as directed and we will return your call as soon as possible. Messages left after 4 pm will be answered the following business day.  ? ?You may also send [place] a message via MyChart. We typically respond to MyChart messages within 1-2 business days. ? ?For prescription refills, please ask your pharmacy to contact our office. Our fax number is [phone]. ? ?If you have an urgent issue when the clinic is closed that cannot wait until the next business day, you can page your doctor at the number below.   ? ?Please note that while we do our best to be available for urgent issues outside of office hours, we are not available 24/7.  ? ?If you have an urgent issue and are unable to reach [place], you may choose to seek medical care at your doctor's office, retail clinic, urgent care center, or emergency room. ? ?If you have a medical emergency, please immediately call 911 or go to the emergency department. ? ?Pager Numbers ? ?- Dr. Nehemiah Massed: [phone] ? ?- Dr. Laurence Ferrari: [phone] ? ?- Dr. Nicole Kindred: [phone] ? ?In the event of inclement  weather, please call our main line at [phone] for an update on the status of any delays or closures. ? ?Dermatology Medication Tips: ?Please keep the boxes that topical medications come in in order to help keep track of the instructions about where and how to use these. Pharmacies typically print the medication instructions only on the boxes and not directly on the medication tubes.  ? ?If your medication is too expensive, please contact our office at [phone] option 4 or send [place] a message through [facility].  ? ?We are unable to tell what your co-pay for medications will be in advance as this is different depending on your insurance coverage. However, we may be able to find a substitute medication at lower cost or fill out paperwork to get insurance to cover a needed medication.  ? ?If a prior authorization is required to get your medication covered by your insurance company,  please allow [place] 1-2 business days to complete this process. ? ?Drug prices often vary depending on where the prescription is filled and some pharmacies may offer cheaper prices. ? ?The website www.goodrx.com contains coupons for medications through different pharmacies. The prices here do not account for what the cost may be with help from insurance (it may be cheaper with your insurance), but the website can give you the price if you did not use any insurance.  ?- You can print the associated coupon and take it with your prescription to the pharmacy.  ?- You may also stop by our office during regular business hours and pick up a GoodRx coupon card.  ?- If you need your prescription sent electronically to a different pharmacy, notify our office through [facility] or by phone at [phone] option 4. ? ? ? ? ?Si Usted Necesita Algo Despu?s de Su Visita ? ?Tambi?n puede enviarnos un mensaje a trav?s de MyChart. Por lo general respondemos a los mensajes de MyChart en el transcurso de 1 a 2 d?as h?biles. ? ?Para renovar recetas,  por favor pida a su farmacia que se ponga en contacto con nuestra oficina. Nuestro n?mero de fax es el [phone]. ? ?Si tiene un asunto urgente cuando la cl?nica est? cerrada y que no puede esperar [employer]

## 2021-06-08 ENCOUNTER — Other Ambulatory Visit: Payer: Self-pay | Admitting: Obstetrics and Gynecology

## 2021-06-08 ENCOUNTER — Ambulatory Visit
Admission: RE | Admit: 2021-06-08 | Discharge: 2021-06-08 | Disposition: A | Discharge status: Home / Self Care | Payer: BC Managed Care – PPO | Source: Ambulatory Visit | Attending: Obstetrics and Gynecology | Admitting: Obstetrics and Gynecology

## 2021-06-08 DIAGNOSIS — R928 Other abnormal and inconclusive findings on diagnostic imaging of breast: Secondary | ICD-10-CM

## 2021-06-08 DIAGNOSIS — N644 Mastodynia: Secondary | ICD-10-CM | POA: Insufficient documentation

## 2021-06-08 DIAGNOSIS — N6320 Unspecified lump in the left breast, unspecified quadrant: Secondary | ICD-10-CM | POA: Insufficient documentation

## 2021-06-08 DIAGNOSIS — R599 Enlarged lymph nodes, unspecified: Secondary | ICD-10-CM

## 2021-06-09 NOTE — Addendum Note (Signed)
Addended by: Ardeth Perfect B on: 07/10/4947 12:14 PM ? ? Modules accepted: Orders ? ?

## 2021-06-20 ENCOUNTER — Ambulatory Visit
Admission: RE | Admit: 2021-06-20 | Discharge: 2021-06-20 | Disposition: A | Discharge status: Home / Self Care | Payer: BC Managed Care – PPO | Source: Ambulatory Visit | Attending: Obstetrics and Gynecology | Admitting: Obstetrics and Gynecology

## 2021-06-20 DIAGNOSIS — R599 Enlarged lymph nodes, unspecified: Secondary | ICD-10-CM | POA: Diagnosis not present

## 2021-06-20 DIAGNOSIS — R928 Other abnormal and inconclusive findings on diagnostic imaging of breast: Secondary | ICD-10-CM

## 2021-06-20 DIAGNOSIS — R590 Localized enlarged lymph nodes: Secondary | ICD-10-CM | POA: Diagnosis not present

## 2021-06-20 DIAGNOSIS — R591 Generalized enlarged lymph nodes: Secondary | ICD-10-CM | POA: Diagnosis not present

## 2021-06-21 ENCOUNTER — Encounter: Payer: Self-pay | Admitting: Obstetrics and Gynecology

## 2021-06-21 ENCOUNTER — Ambulatory Visit (INDEPENDENT_AMBULATORY_CARE_PROVIDER_SITE_OTHER): Payer: BC Managed Care – PPO | Admitting: Obstetrics and Gynecology

## 2021-06-21 VITALS — BP 124/80 | Ht 61.0 in | Wt 193.0 lb

## 2021-06-21 DIAGNOSIS — Z01419 Encounter for gynecological examination (general) (routine) without abnormal findings: Secondary | ICD-10-CM

## 2021-06-21 DIAGNOSIS — N6320 Unspecified lump in the left breast, unspecified quadrant: Secondary | ICD-10-CM

## 2021-06-21 DIAGNOSIS — Z30431 Encounter for routine checking of intrauterine contraceptive device: Secondary | ICD-10-CM | POA: Diagnosis not present

## 2021-06-21 NOTE — Progress Notes (Signed)
? ?PCP:  Pcp, No ? ? ?Chief Complaint  ?Patient presents with  ? Gynecologic Exam  ?  No concerns  ? ? ? ?HPI: ?     [name] is a 41 y.o. G4P0040 who LMP was No LMP recorded. (Menstrual status: IUD)., presents today for her annual examination.  Her menses are every few wks to monthly with Eileen Garcia, light spotting for a few days, mild dysmen, improved with tylenol. Did POPs in past for better cycle control with IUD, but no longer needed/prefers not to be on them.  Skyla placed 2017 due to anovulatory bleeding/menorrhagia with Dr. Laurey Morale. Suspicion of endometriosis given hx of pelvic pain. Failed mult OCPs, ortho evra and depo. Hx of migraines with aura. ? ?Sex activity: single partner, contraception - IUD. Eileen Garcia placed 07/08/18. Pt with long hx of dyspareunia. ?Last Pap: 02/11/18  Results were: no abnormalities /neg HPV DNA  ?Hx of STDs: none ? ?There is no FH of breast cancer. There is a FH of uterine vs ovarian cancer in PGM (no chemo/rad done), genetic testing not done. The patient does do self-breast exams. Had painful LT breast mass 5/23; had neg mammo and u/s at area of tenderness. Did note RT axillary LAN, s/p bx yesterday, awaiting results; also with indeterminate LT breast mass inner quadrant needing 6 mos f/u imaging 11/23. Pt declines breast exam today ? ?Tobacco use: 1/2-3/4 ppd; not ready to quit ?Alcohol use: none ?No drug use.  ?Exercise: moderately active ? ?She does not get adequate calcium or Vitamin D in her diet. Many vitamins make her nauseated.  ? ? ?Past Medical History:  ?Diagnosis Date  ? Amenorrhea   ? Anxiety   ? Asthma   ? allergy induced  ? Atypical mole 12/17/2017  ? Atypical combined nevus, Right upper arm, excision  ? Complication of anesthesia   ? pt states she was given sedation prior to her nasal surgery in preop, then pt woke up on her way back to [place] pts surgery pt was very agitated and yelling   ? Dysplastic nevus 12/03/2017  ? L mid upper back lateral , moderate.  Limited margins free  ? Family history of adverse reaction to anesthesia   ? dad-vomits and very emotional after surgery  ? GERD (gastroesophageal reflux disease)   ? no meds  ? Headache   ? History of kidney stones 04/2018  ? Lumbar back pain 07/23/2018  ? Menorrhagia   ? Migraine with aura   ? Missed abortion   ? Pelvic pain   ? Polycystic ovaries   ? PONV (postoperative nausea and vomiting)   ? ? ?Past Surgical History:  ?Procedure Laterality Date  ? ACHILLES TENDON SURGERY Left 07/26/2018  ? Procedure: ACHILLES TENDON REPAIR SECONDARY;  Surgeon: Samara Deist, DPM;  Location: [facility] ORS;  Service: Podiatry;  Laterality: Left;  ? NASAL SEPTUM SURGERY  2012-2013  ? with Dr Kathyrn Sheriff  ? TYMPANOSTOMY TUBE PLACEMENT    ? 3-4 different surgeries  ? WISDOM TOOTH EXTRACTION    ? ? ?Family History  ?Problem Relation Age of Onset  ? Hypertension Father   ? Migraines Paternal Grandmother   ? Diabetes Paternal Grandmother   ?     type 1  ? Hypertension Paternal Grandmother   ? Uterine cancer Paternal Grandmother 54  ?     vs ovar--no chemo/rad done  ? ? ?Social History  ? ?Socioeconomic History  ? Marital status: Married  ?  Spouse name: Not on  file  ? Number of children: Not on file  ? Years of education: Not on file  ? Highest education level: Not on file  ?Occupational History  ? Not on file  ?Tobacco Use  ? Smoking status: Former  ?  Packs/day: 0.50  ?  Years: 10.00  ?  Pack years: 5.00  ?  Types: Cigarettes  ?  Quit date: 07/22/2008  ?  Years since quitting: 12.9  ? Smokeless tobacco: Never  ?Vaping Use  ? Vaping Use: Never used  ?Substance and Sexual Activity  ? Alcohol use: Yes  ?  Comment: rare  ? Drug use: No  ? Sexual activity: Yes  ?  Birth control/protection: I.U.D.  ?  Comment: Skyla  ?Other Topics Concern  ? Not on file  ?Social History Narrative  ? Not on file  ? ?Social Determinants of Health  ? ?Financial Resource Strain: Not on file  ?Food Insecurity: Not on file  ?Transportation Needs: Not on file  ?Physical  Activity: Not on file  ?Stress: Not on file  ?Social Connections: Not on file  ?Intimate Partner Violence: Not on file  ? ? ?Current Meds  ?Medication Sig  ? cetirizine (ZYRTEC) 10 MG tablet Take 10 mg by mouth at bedtime.   ? Levonorgestrel (SKYLA) 13.5 MG IUD by Intrauterine route.  ? ? ? ?ROS: ? ?Review of Systems  ?Constitutional:  Negative for fatigue, fever and unexpected weight change.  ?Respiratory:  Negative for cough, shortness of breath and wheezing.   ?Cardiovascular:  Negative for chest pain, palpitations and leg swelling.  ?Gastrointestinal:  Negative for blood in stool, constipation, diarrhea, nausea and vomiting.  ?Endocrine: Negative for cold intolerance, heat intolerance and polyuria.  ?Genitourinary:  Negative for dyspareunia, dysuria, flank pain, frequency, genital sores, hematuria, menstrual problem, pelvic pain, urgency, vaginal bleeding, vaginal discharge and vaginal pain.  ?Musculoskeletal:  Negative for back pain, joint swelling and myalgias.  ?Skin:  Negative for rash.  ?Neurological:  Negative for dizziness, syncope, light-headedness, numbness and headaches.  ?Hematological:  Negative for adenopathy.  ?Psychiatric/Behavioral:  Negative for agitation, confusion, sleep disturbance and suicidal ideas. The patient is not nervous/anxious.   ? ? ?Objective: ?BP 124/80   Ht '5\' 1"'$  (1.549 m)   Wt 193 lb (87.5 kg)   BMI 36.47 kg/m?  ? ?Physical Exam ?Constitutional:   ?   Appearance: She is well-developed.  ?Genitourinary:  ?   Vulva normal.  ?   Genitourinary Comments: BREAST EXAM DECLINED TODAY BY PT DUE TO RECENT IMAGING/BX  ?   Right Labia: No rash, tenderness or lesions. ?   Left Labia: No tenderness, lesions or rash. ?   No vaginal discharge, erythema or tenderness.  ? ?   Right Adnexa: not tender and no mass present. ?   Left Adnexa: not tender and no mass present. ?   No cervical friability or polyp.  ?   IUD strings visualized.  ?   Uterus is not enlarged or tender.  ?Neck:  ?   Thyroid:  No thyromegaly.  ?Cardiovascular:  ?   Rate and Rhythm: Normal rate and regular rhythm.  ?   Heart sounds: Normal heart sounds. No murmur heard. ?Pulmonary:  ?   Effort: Pulmonary effort is normal.  ?   Breath sounds: Normal breath sounds.  ?Abdominal:  ?   Palpations: Abdomen is soft.  ?   Tenderness: There is no abdominal tenderness. There is no guarding or rebound.  ?Musculoskeletal:     ?  General: Normal range of motion.  ?   Cervical back: Normal range of motion.  ?Lymphadenopathy:  ?   Cervical: No cervical adenopathy.  ?Neurological:  ?   General: No focal deficit present.  ?   Mental Status: She is alert and oriented to person, place, and time.  ?   Cranial Nerves: No cranial nerve deficit.  ?Skin: ?   General: Skin is warm and dry.  ?Psychiatric:     ?   Mood and Affect: Mood normal.     ?   Behavior: Behavior normal.     ?   Thought Content: Thought content normal.     ?   Judgment: Judgment normal.  ?Vitals reviewed.  ? ? ?Assessment/Plan: ?Encounter for annual routine gynecological examination ? ?Encounter for routine checking of intrauterine contraceptive device (IUD)--IUD strings in cx os; due for replacement 6/25 ? ?Mass of left breast, unspecified quadrant - Plan: MM DIAG BREAST TOMO UNI LEFT, US BREAST LTD UNI LEFT INC AXILLA; pt to schedule f/u imaging for 11/23. Awaiting axillary bx results.  ? ?  ?GYN counsel adequate intake of calcium and vitamin D, diet and exercise ? ? ?  F/U ? Return in about 1 year (around 06/22/2022). ? ?Merrilee Ancona B. [place] Bryngelson, PA-C ?06/21/2021 ?2:16 PM ?

## 2021-06-21 NOTE — Patient Instructions (Addendum)
I value your feedback and you entrusting us with your care. If you get a [facility] patient survey, I would appreciate you taking the time to let us know about your experience today. Thank you!  [facility]: [phone]      

## 2021-06-29 LAB — SURGICAL PATHOLOGY

## 2021-06-30 ENCOUNTER — Encounter: Payer: Self-pay | Admitting: Body Imaging

## 2021-07-01 HISTORY — PX: BREAST BIOPSY: SHX20

## 2021-07-05 ENCOUNTER — Telehealth: Payer: Self-pay | Admitting: Obstetrics and Gynecology

## 2021-07-05 ENCOUNTER — Ambulatory Visit: Payer: BC Managed Care – PPO | Admitting: Dermatology

## 2021-07-05 ENCOUNTER — Encounter: Payer: Self-pay | Admitting: Dermatology

## 2021-07-05 DIAGNOSIS — B070 Plantar wart: Secondary | ICD-10-CM | POA: Diagnosis not present

## 2021-07-05 DIAGNOSIS — R202 Paresthesia of skin: Secondary | ICD-10-CM | POA: Diagnosis not present

## 2021-07-05 DIAGNOSIS — R590 Localized enlarged lymph nodes: Secondary | ICD-10-CM

## 2021-07-05 NOTE — Progress Notes (Signed)
Follow-Up Visit   Subjective  Eileen Garcia is a 41 y.o. female who presents for the following: Warts (Right lateral plantar foot at ball. 1 month recheck. Tx with LN2, cantharone and squaric acid at last visit. Patient reports she couldn't walk very well for 2 weeks. Area was very painful).  She also has persistent itchy spot by shoulder blade, worse at night.  No rash or spot noticed in area.    The following portions of the chart were reviewed this encounter and updated as appropriate:      Review of Systems: No other skin or systemic complaints except as noted in HPI or Assessment and Plan.   Objective  Well appearing patient in no apparent distress; mood and affect are within normal limits.  A focused examination was performed including right foot. Relevant physical exam findings are noted in the Assessment and Plan.  Right 5th Metatarsal Plantar Area x1 Small residual verrucous papule  Right Lower Back Normal appearing skin on clinical exam   Assessment & Plan  Plantar wart Right 5th Metatarsal Plantar Area x1  Improved with small residual wart  Discussed viral etiology and risk of spread.  Discussed multiple treatments may be required to clear warts.  Discussed possible post-treatment dyspigmentation and risk of recurrence.     Recommend using Curad Mediplast pads for surrounding callous and for any residual wart     Destruction of lesion - Right 5th Metatarsal Plantar Area x1  Destruction method: cryotherapy   Informed consent: discussed and consent obtained   Debridement: hyperkeratotic portion removed with sharp debridement   Debridement comment:  Pared with #15 blade. Lesion destroyed using liquid nitrogen: Yes   Region frozen until ice ball extended beyond lesion: Yes   Outcome: patient tolerated procedure well with no complications   Post-procedure details: wound care instructions given   Additional details:  Prior to procedure, discussed risks of  blister formation, small wound, skin dyspigmentation, or rare scar following cryotherapy. Recommend Vaseline ointment to treated areas while healing.   Notalgia paresthetica Right Lower Back  Notalgia paresthetica is a chronic condition affecting the skin of the back in which a pinched nerve along the spine causes itching or changes in sensation in an area of skin. This is usually accompanied by chronic rubbing or scratching often leaving the area of skin discolored and thickened. There is no cure, but there are some treatments which may help control the itch.   Over the counter (non-prescription) treatments for notalgia paresthetica include numbing creams like pramoxine or lidocaine which temporarily reduce itch or Capsaicin-containing creams which cause a burning sensation but which sometimes over time will reset the nerves to stop producing itch.  If you choose to use Capsaicin cream, it is recommended to use it 5 times daily for 1 week followed by 3 times daily for 3-6 weeks. You may have to continue using it long-term. For severe cases, there are some prescription cream or pill options which may help. Other treatment options include: - Transcutaneous Electrical Nerve Stimulation (TENS) - Gabapentin 300-900 mg daily po - Amitriptyline orally - Paravertebral local anesthetic block - intralesional Botulinum toxin A  Recommend OTC Gold Bond Rapid Relief Anti-Itch cream (pramoxine + menthol), CeraVe Anti-itch cream or lotion (pramoxine), Sarna lotion (Original- menthol + camphor or Sensitive- pramoxine) or Eucerin 12 hour Itch Relief lotion (menthol) up to 3 times per day to areas on body that are itchy.     Return in about 1 year (around 07/06/2022)  for TBSE.  I, Emelia Salisbury, CMA, am acting as scribe for Brendolyn Patty, MD.  Documentation: I have reviewed the above documentation for accuracy and completeness, and I agree with the above.  Brendolyn Patty MD

## 2021-07-05 NOTE — Patient Instructions (Addendum)
Recommend using Curad Mediplast pads between treatments.  Cut to fit. Apply to wart. Cover with tape, Elastoplast tape is waterproof. Change every 3-4 days.  Cryotherapy Aftercare  Wash gently with soap and water everyday.   Apply Vaseline and Band-Aid daily until healed.   Prior to procedure, discussed risks of blister formation, small wound, skin dyspigmentation, or rare scar following cryotherapy. Recommend Vaseline ointment to treated areas while healing.    Notalgia paresthetica is a chronic condition affecting the skin of the back in which a pinched nerve along the spine causes itching or changes in sensation in an area of skin. This is usually accompanied by chronic rubbing or scratching often leaving the area of skin discolored and thickened. There is no cure, but there are some treatments which may help control the itch.   Over the counter (non-prescription) treatments for notalgia paresthetica include numbing creams like pramoxine or lidocaine which temporarily reduce itch or Capsaicin-containing creams which cause a burning sensation but which sometimes over time will reset the nerves to stop producing itch.  If you choose to use Capsaicin cream, it is recommended to use it 5 times daily for 1 week followed by 3 times daily for 3-6 weeks. You may have to continue using it long-term. For severe cases, there are some prescription cream or pill options which may help. Other treatment options include: - Transcutaneous Electrical Nerve Stimulation (TENS) - Gabapentin 300-900 mg daily po - Amitriptyline orally - Paravertebral local anesthetic block - intralesional Botulinum toxin A   If You Need Anything After Your Visit  If you have any questions or concerns for your doctor, please call our main line at [phone] and press option 4 to reach your doctor's medical assistant. If no one answers, please leave a voicemail as directed and we will return your call as soon as possible.  Messages left after 4 pm will be answered the following business day.   You may also send [place] a message via [place]. We typically respond to MyChart messages within 1-2 business days.  For prescription refills, please ask your pharmacy to contact our office. Our fax number is [phone].  If you have an urgent issue when the clinic is closed that cannot wait until the next business day, you can page your doctor at the number below.    Please note that while we do our best to be available for urgent issues outside of office hours, we are not available 24/7.   If you have an urgent issue and are unable to reach [place], you may choose to seek medical care at your doctor's office, retail clinic, urgent care center, or emergency room.  If you have a medical emergency, please immediately call 911 or go to the emergency department.  Pager Numbers  - Dr. Nehemiah Massed: [phone]  - Dr. Laurence Ferrari: [phone]  - Dr. Nicole Kindred: [phone]  In the event of inclement weather, please call our main line at [phone] for an update on the status of any delays or closures.  Dermatology Medication Tips: Please keep the boxes that topical medications come in in order to help keep track of the instructions about where and how to use these. Pharmacies typically print the medication instructions only on the boxes and not directly on the medication tubes.   If your medication is too expensive, please contact our office at [phone] option 4 or send [place] a message through [facility].   We are unable to tell what your co-pay for medications will be in  advance as this is different depending on your insurance coverage. However, we may be able to find a substitute medication at lower cost or fill out paperwork to get insurance to cover a needed medication.   If a prior authorization is required to get your medication covered by your insurance company, please allow [place] 1-2 business days to complete this process.  Drug  prices often vary depending on where the prescription is filled and some pharmacies may offer cheaper prices.  The website www.goodrx.com contains coupons for medications through different pharmacies. The prices here do not account for what the cost may be with help from insurance (it may be cheaper with your insurance), but the website can give you the price if you did not use any insurance.  - You can print the associated coupon and take it with your prescription to the pharmacy.  - You may also stop by our office during regular business hours and pick up a GoodRx coupon card.  - If you need your prescription sent electronically to a different pharmacy, notify our office through [facility] or by phone at [phone] option 4.     Si Usted Necesita Algo Despus de Su Visita  Tambin puede enviarnos un mensaje a travs de Pharmacist, community. Por lo general respondemos a los mensajes de MyChart en el transcurso de 1 a 2 das hbiles.  Para renovar recetas, por favor pida a su farmacia que se ponga en contacto con nuestra oficina. Harland Dingwall de fax es [place] [phone].  Si tiene un asunto urgente cuando la clnica est cerrada y que no puede esperar hasta el siguiente da hbil, puede llamar/localizar a su doctor(a) al nmero que aparece a continuacin.   Por favor, tenga en cuenta que aunque hacemos todo lo posible para estar disponibles para asuntos urgentes fuera del horario de [place], no estamos disponibles las 24 horas del da, los 7 das de la [place].   Si tiene un problema urgente y no puede comunicarse con nosotros, puede optar por buscar atencin mdica  en el consultorio de su doctor(a), en una clnica privada, en un centro de atencin urgente o en una sala de emergencias.  Si tiene Engineering geologist, por favor llame inmediatamente al 911 o vaya a la sala de emergencias.  Nmeros de bper  - Dr. Nehemiah Massed: [phone]  - Dra. Moye: [phone]  - Dra. Nicole Kindred:  [phone]  En caso de inclemencias del [place], por favor llame a Johnsie Kindred principal al [phone] para una actualizacin sobre el [place] de cualquier retraso o cierre.  Consejos para la medicacin en dermatologa: Por favor, guarde las cajas en las que vienen los medicamentos de uso tpico para ayudarle a seguir las instrucciones sobre dnde y cmo usarlos. Las farmacias generalmente imprimen las instrucciones del medicamento slo en las cajas y no directamente en los tubos del [place].   Si su medicamento es muy caro, por favor, pngase en contacto con Zigmund Daniel llamando al [phone] y presione la opcin 4 o envenos un mensaje a travs de Pharmacist, community.   No podemos decirle cul ser su copago por los medicamentos por adelantado ya que esto es diferente dependiendo de la cobertura de su seguro. Sin embargo, es posible que podamos encontrar un medicamento sustituto a Electrical engineer un formulario para que el seguro cubra el medicamento que se considera necesario.   Si se requiere una autorizacin previa para que su compaa de seguros [place] su medicamento, por favor permtanos de 1 a 2  das hbiles para completar [place].  Los precios de los medicamentos varan con frecuencia dependiendo del Environmental consultant de dnde se surte la receta y alguna farmacias pueden ofrecer precios ms baratos.  El sitio web www.goodrx.com tiene cupones para medicamentos de Airline pilot. Los precios aqu no tienen en cuenta lo que podra costar con la ayuda del seguro (puede ser ms barato con su seguro), pero el sitio web puede darle el precio si no utiliz Research scientist (physical sciences).  - Puede imprimir el cupn correspondiente y llevarlo con su receta a la farmacia.  - Tambin puede pasar por nuestra oficina durante el horario de atencin regular y Charity fundraiser una tarjeta de cupones de GoodRx.  - Si necesita que su receta se enve electrnicamente a una farmacia diferente, informe a nuestra oficina a travs de  MyChart de [place] o por telfono llamando al [phone] y presione la opcin 4.  Recommend OTC Gold Bond Rapid Relief Anti-Itch cream (pramoxine + menthol), CeraVe Anti-itch cream or lotion (pramoxine), Sarna lotion (Original- menthol + camphor or Sensitive- pramoxine) or Eucerin 12 hour Itch Relief lotion (menthol) up to 3 times per day to areas on body that are itchy.

## 2021-07-05 NOTE — Telephone Encounter (Signed)
Spoke with pt re: neg axillary LAN bx. Pt has f/u imaging in 6 months. Will check labs in case infectious vs auto-immune, per pt request. Pt denies recent cat scratch. Doesn't have PCP currently. Will f/u with results.

## 2021-07-06 ENCOUNTER — Other Ambulatory Visit: Payer: BC Managed Care – PPO

## 2021-07-06 DIAGNOSIS — R590 Localized enlarged lymph nodes: Secondary | ICD-10-CM

## 2021-07-07 LAB — CBC WITH DIFFERENTIAL/PLATELET
Basophils Absolute: 0 10*3/uL (ref 0.0–0.2)
Basos: 0 %
EOS (ABSOLUTE): 0.1 10*3/uL (ref 0.0–0.4)
Eos: 2 %
Hematocrit: 46.5 % (ref 34.0–46.6)
Hemoglobin: 16 g/dL — ABNORMAL HIGH (ref 11.1–15.9)
Immature Grans (Abs): 0 10*3/uL (ref 0.0–0.1)
Immature Granulocytes: 0 %
Lymphocytes Absolute: 2 10*3/uL (ref 0.7–3.1)
Lymphs: 25 %
MCH: 30.8 pg (ref 26.6–33.0)
MCHC: 34.4 g/dL (ref 31.5–35.7)
MCV: 89 fL (ref 79–97)
Monocytes Absolute: 0.4 10*3/uL (ref 0.1–0.9)
Monocytes: 5 %
Neutrophils Absolute: 5.2 10*3/uL (ref 1.4–7.0)
Neutrophils: 68 %
Platelets: 331 10*3/uL (ref 150–450)
RBC: 5.2 x10E6/uL (ref 3.77–5.28)
RDW: 12.3 % (ref 11.7–15.4)
WBC: 7.7 10*3/uL (ref 3.4–10.8)

## 2021-07-07 LAB — HIV ANTIBODY (ROUTINE TESTING W REFLEX): HIV Screen 4th Generation wRfx: NONREACTIVE

## 2021-07-19 ENCOUNTER — Other Ambulatory Visit: Payer: Self-pay

## 2021-07-19 ENCOUNTER — Other Ambulatory Visit: Payer: Self-pay | Admitting: Obstetrics and Gynecology

## 2021-07-19 ENCOUNTER — Other Ambulatory Visit: Payer: BC Managed Care – PPO

## 2021-07-19 DIAGNOSIS — R590 Localized enlarged lymph nodes: Secondary | ICD-10-CM

## 2021-07-19 DIAGNOSIS — R768 Other specified abnormal immunological findings in serum: Secondary | ICD-10-CM

## 2021-07-20 LAB — ANA W/REFLEX IF POSITIVE
Anti JO-1: <0.2 AI (ref 0.0–0.9)
Anti Nuclear Antibody (ANA): POSITIVE — AB
Centromere Ab Screen: <0.2 AI (ref 0.0–0.9)
Chromatin Ab SerPl-aCnc: <0.2 AI (ref 0.0–0.9)
ENA RNP Ab: 1.7 AI — ABNORMAL HIGH (ref 0.0–0.9)
ENA SM Ab Ser-aCnc: <0.2 AI (ref 0.0–0.9)
ENA SSA (RO) Ab: >8 AI — ABNORMAL HIGH (ref 0.0–0.9)
ENA SSB (LA) Ab: <0.2 AI (ref 0.0–0.9)
Scleroderma (Scl-70) (ENA) Antibody, IgG: <0.2 AI (ref 0.0–0.9)
dsDNA Ab: 1 IU/mL (ref 0–9)

## 2021-07-20 NOTE — Progress Notes (Signed)
Referral to rheumatology for LAN and positive ANA.

## 2021-07-20 NOTE — Addendum Note (Signed)
Addended by: Ardeth Perfect B on: 4/48/1856 04:49 PM   Modules accepted: Orders

## 2021-07-27 ENCOUNTER — Encounter: Payer: Self-pay | Admitting: Obstetrics and Gynecology

## 2021-08-01 DIAGNOSIS — N96 Recurrent pregnancy loss: Secondary | ICD-10-CM | POA: Diagnosis not present

## 2021-08-01 DIAGNOSIS — M3500 Sicca syndrome, unspecified: Secondary | ICD-10-CM | POA: Diagnosis not present

## 2021-09-27 ENCOUNTER — Encounter: Payer: Self-pay | Admitting: Obstetrics and Gynecology

## 2021-09-27 MED ORDER — VALACYCLOVIR HCL 1 G PO TABS: ORAL_TABLET | ORAL | 1 refills | Status: DC

## 2021-12-01 DIAGNOSIS — M3500 Sicca syndrome, unspecified: Secondary | ICD-10-CM | POA: Diagnosis not present

## 2021-12-16 ENCOUNTER — Encounter: Payer: Self-pay | Admitting: Obstetrics and Gynecology

## 2021-12-16 NOTE — Telephone Encounter (Signed)
Check appts

## 2021-12-21 NOTE — Telephone Encounter (Signed)
Pls put pt on for 3:15 for Monday, Nov 20. And let her know. I didn't message her in case appt gets taken first. Thx.

## 2021-12-22 ENCOUNTER — Ambulatory Visit
Admission: RE | Admit: 2021-12-22 | Discharge: 2021-12-22 | Disposition: A | Discharge status: Home / Self Care | Payer: BC Managed Care – PPO | Source: Ambulatory Visit | Attending: Obstetrics and Gynecology | Admitting: Obstetrics and Gynecology

## 2021-12-22 DIAGNOSIS — R92332 Mammographic heterogeneous density, left breast: Secondary | ICD-10-CM | POA: Diagnosis not present

## 2021-12-22 DIAGNOSIS — N6322 Unspecified lump in the left breast, upper inner quadrant: Secondary | ICD-10-CM | POA: Diagnosis not present

## 2021-12-22 DIAGNOSIS — N6320 Unspecified lump in the left breast, unspecified quadrant: Secondary | ICD-10-CM | POA: Diagnosis not present

## 2021-12-22 DIAGNOSIS — N6324 Unspecified lump in the left breast, lower inner quadrant: Secondary | ICD-10-CM | POA: Diagnosis not present

## 2021-12-22 NOTE — Telephone Encounter (Signed)
Pt is scheduled on Mon. Nov. 20 at 3:15.

## 2021-12-23 ENCOUNTER — Encounter: Payer: Self-pay | Admitting: Obstetrics and Gynecology

## 2021-12-23 ENCOUNTER — Other Ambulatory Visit: Payer: Self-pay | Admitting: Obstetrics and Gynecology

## 2021-12-23 DIAGNOSIS — R590 Localized enlarged lymph nodes: Secondary | ICD-10-CM

## 2021-12-23 DIAGNOSIS — Z1231 Encounter for screening mammogram for malignant neoplasm of breast: Secondary | ICD-10-CM

## 2021-12-25 NOTE — Progress Notes (Unsigned)
Pcp, No   No chief complaint on file.   HPI:      [name] is a 41 y.o. [id number] whose LMP was No LMP recorded. (Menstrual status: IUD)., presents today for ***  Vaginal pain for a few wks  Patient Active Problem List   Diagnosis Date Noted   History of multiple miscarriages 02/11/2018   PCOS (polycystic ovarian syndrome) 12/14/2016   Menometrorrhagia 10/12/2016    Past Surgical History:  Procedure Laterality Date   ACHILLES TENDON SURGERY Left 07/26/2018   Procedure: ACHILLES TENDON REPAIR SECONDARY;  Surgeon: Samara Deist, DPM;  Location: [facility] ORS;  Service: Podiatry;  Laterality: Left;   BREAST BIOPSY Left 07/01/2021   LEFT u/s guided bx-- Reactive lymphoid tissue with acute inflammation and associated necrosis.   NASAL SEPTUM SURGERY  2012-2013   with Dr Kathyrn Sheriff   TYMPANOSTOMY TUBE PLACEMENT     3-4 different surgeries   WISDOM TOOTH EXTRACTION      Family History  Problem Relation Age of Onset   Hypertension Father    Migraines Paternal Grandmother    Diabetes Paternal Grandmother        type 1   Hypertension Paternal Grandmother    Uterine cancer Paternal Grandmother 48       vs ovar--no chemo/rad done   Breast cancer Neg Hx     Social History   Socioeconomic History   Marital status: Married    Spouse name: Not on file   Number of children: Not on file   Years of education: Not on file   Highest education level: Not on file  Occupational History   Not on file  Tobacco Use   Smoking status: Former    Packs/day: 0.50    Years: 10.00    Total pack years: 5.00    Types: Cigarettes    Quit date: 07/22/2008    Years since quitting: 13.4   Smokeless tobacco: Never  Vaping Use   Vaping Use: Never used  Substance and Sexual Activity   Alcohol use: Yes    Comment: rare   Drug use: No   Sexual activity: Yes    Birth control/protection: I.U.D.    Comment: Skyla  Other Topics Concern   Not on file  Social History Narrative   Not on  file   Social Determinants of Health   Financial Resource Strain: Not on file  Food Insecurity: Not on file  Transportation Needs: Not on file  Physical Activity: Not on file  Stress: Not on file  Social Connections: Not on file  Intimate Partner Violence: Not on file    Outpatient Medications Prior to Visit  Medication Sig Dispense Refill   cetirizine (ZYRTEC) 10 MG tablet Take 10 mg by mouth at bedtime.      Levonorgestrel (SKYLA) 13.5 MG IUD by Intrauterine route.     valACYclovir (VALTREX) 1000 MG tablet Take 2 tabs BID x 1 day prn sx 30 tablet 1   No facility-administered medications prior to visit.      ROS:  Review of Systems BREAST: No symptoms   OBJECTIVE:   Vitals:  There were no vitals taken for this visit.  Physical Exam  Results: No results found for this or any previous visit (from the past 24 hour(s)).   Assessment/Plan: No diagnosis found.    No orders of the defined types were placed in this encounter.     No follow-ups on file.  Shreyansh Tiffany B. Eileen Mcnew, PA-C 12/25/2021 8:40  PM

## 2021-12-26 ENCOUNTER — Encounter: Payer: Self-pay | Admitting: Obstetrics and Gynecology

## 2021-12-26 ENCOUNTER — Ambulatory Visit: Payer: BC Managed Care – PPO | Admitting: Obstetrics and Gynecology

## 2021-12-26 VITALS — BP 118/70 | Ht 61.0 in | Wt 189.0 lb

## 2021-12-26 DIAGNOSIS — N898 Other specified noninflammatory disorders of vagina: Secondary | ICD-10-CM | POA: Diagnosis not present

## 2021-12-26 MED ORDER — CLOTRIMAZOLE-BETAMETHASONE 1-0.05 % EX CREA: TOPICAL_CREAM | CUTANEOUS | 0 refills | Status: DC

## 2022-01-02 ENCOUNTER — Ambulatory Visit: Payer: BC Managed Care – PPO | Admitting: Dermatology

## 2022-02-21 DIAGNOSIS — M545 Low back pain, unspecified: Secondary | ICD-10-CM | POA: Diagnosis not present

## 2022-02-21 DIAGNOSIS — Z3202 Encounter for pregnancy test, result negative: Secondary | ICD-10-CM | POA: Diagnosis not present

## 2022-03-13 ENCOUNTER — Other Ambulatory Visit: Payer: Self-pay | Admitting: Obstetrics and Gynecology

## 2022-03-17 ENCOUNTER — Encounter: Payer: Self-pay | Admitting: Obstetrics and Gynecology

## 2022-03-17 DIAGNOSIS — R051 Acute cough: Secondary | ICD-10-CM | POA: Diagnosis not present

## 2022-03-18 ENCOUNTER — Other Ambulatory Visit: Payer: Self-pay | Admitting: Obstetrics and Gynecology

## 2022-03-18 NOTE — Progress Notes (Unsigned)
   No chief complaint on file.    History of Present Illness:  Eileen Garcia is a 42 y.o. that had a {IUD:23561} IUD placed approximately {NUMBERS:20191} {Weeks/Months:20313} ago. Since that time, she denies dyspareunia, pelvic pain, non-menstrual bleeding, vaginal d/c, heavy bleeding.   Review of Systems  Physical Exam:  There were no vitals taken for this visit. There is no height or weight on file to calculate BMI.  Pelvic exam:  Two IUD strings {DESC; PRESENT/ABSENT:17923} seen coming from the cervical os. EGBUS, vaginal vault and cervix: within normal limits   Assessment:   No diagnosis found.  IUD strings present in proper location; pt doing well  Plan: F/u if any signs of infection or can no longer feel the strings.   Gregroy Dombkowski B. Kaylib Furness, PA-C 03/18/2022 8:38 AM

## 2022-03-20 DIAGNOSIS — R051 Acute cough: Secondary | ICD-10-CM | POA: Diagnosis not present

## 2022-03-20 DIAGNOSIS — R059 Cough, unspecified: Secondary | ICD-10-CM | POA: Diagnosis not present

## 2022-03-20 MED ORDER — VALACYCLOVIR HCL 1 G PO TABS: 1000.0000 mg | ORAL_TABLET | Freq: Every day | ORAL | 1 refills | Status: DC

## 2022-03-31 DIAGNOSIS — R052 Subacute cough: Secondary | ICD-10-CM | POA: Diagnosis not present

## 2022-03-31 DIAGNOSIS — H7291 Unspecified perforation of tympanic membrane, right ear: Secondary | ICD-10-CM | POA: Diagnosis not present

## 2022-03-31 DIAGNOSIS — Z09 Encounter for follow-up examination after completed treatment for conditions other than malignant neoplasm: Secondary | ICD-10-CM | POA: Diagnosis not present

## 2022-04-25 DIAGNOSIS — H902 Conductive hearing loss, unspecified: Secondary | ICD-10-CM | POA: Diagnosis not present

## 2022-04-25 DIAGNOSIS — H6983 Other specified disorders of Eustachian tube, bilateral: Secondary | ICD-10-CM | POA: Diagnosis not present

## 2022-04-25 DIAGNOSIS — H6523 Chronic serous otitis media, bilateral: Secondary | ICD-10-CM | POA: Diagnosis not present

## 2022-04-25 DIAGNOSIS — H900 Conductive hearing loss, bilateral: Secondary | ICD-10-CM | POA: Diagnosis not present

## 2022-05-23 DIAGNOSIS — R42 Dizziness and giddiness: Secondary | ICD-10-CM | POA: Diagnosis not present

## 2022-05-23 DIAGNOSIS — H6983 Other specified disorders of Eustachian tube, bilateral: Secondary | ICD-10-CM | POA: Diagnosis not present

## 2022-06-13 ENCOUNTER — Encounter: Payer: Self-pay | Admitting: Emergency Medicine

## 2022-06-13 ENCOUNTER — Emergency Department
Admission: EM | Admit: 2022-06-13 | Discharge: 2022-06-13 | Disposition: A | Discharge status: Home / Self Care | Payer: BC Managed Care – PPO | Attending: Emergency Medicine | Admitting: Emergency Medicine

## 2022-06-13 ENCOUNTER — Other Ambulatory Visit: Payer: Self-pay

## 2022-06-13 ENCOUNTER — Emergency Department: Payer: BC Managed Care – PPO

## 2022-06-13 DIAGNOSIS — S0990XA Unspecified injury of head, initial encounter: Secondary | ICD-10-CM | POA: Diagnosis not present

## 2022-06-13 DIAGNOSIS — S022XXA Fracture of nasal bones, initial encounter for closed fracture: Secondary | ICD-10-CM | POA: Insufficient documentation

## 2022-06-13 DIAGNOSIS — W2202XA Walked into lamppost, initial encounter: Secondary | ICD-10-CM | POA: Diagnosis not present

## 2022-06-13 DIAGNOSIS — R4182 Altered mental status, unspecified: Secondary | ICD-10-CM | POA: Diagnosis not present

## 2022-06-13 DIAGNOSIS — S060X0A Concussion without loss of consciousness, initial encounter: Secondary | ICD-10-CM | POA: Insufficient documentation

## 2022-06-13 DIAGNOSIS — M47812 Spondylosis without myelopathy or radiculopathy, cervical region: Secondary | ICD-10-CM | POA: Diagnosis not present

## 2022-06-13 MED ORDER — ACETAMINOPHEN 325 MG PO TABS
650.0000 mg | ORAL_TABLET | Freq: Once | ORAL | Status: AC
  Administered 2022-06-13: 650 mg via ORAL
  Filled 2022-06-13: qty 2

## 2022-06-13 MED ORDER — NAPROXEN 500 MG PO TABS: 500.0000 mg | ORAL_TABLET | Freq: Two times a day (BID) | ORAL | 0 refills | Status: AC

## 2022-06-13 NOTE — Discharge Instructions (Addendum)
Your CT head and neck are normal.  Your CT facial bones shows a left sided nasal bone and maxillary sinus fracture.  Please follow-up with ENT as we discussed.  Additionally, please practice concussion precautions as we discussed including brain rest, decrease screen time, and reduction of any activity that may result in a repeat head injury.  You may continue to take Tylenol/ibuprofen per package instructions to help with your symptoms.  Please return for any new, worsening, or change in symptoms or other concerns.  It was a pleasure caring for you today.
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
## 2022-06-19 DIAGNOSIS — S022XXA Fracture of nasal bones, initial encounter for closed fracture: Secondary | ICD-10-CM | POA: Diagnosis not present

## 2022-06-28 ENCOUNTER — Ambulatory Visit
Admission: RE | Admit: 2022-06-28 | Discharge: 2022-06-28 | Disposition: A | Discharge status: Home / Self Care | Payer: BC Managed Care – PPO | Source: Ambulatory Visit | Attending: Obstetrics and Gynecology | Admitting: Obstetrics and Gynecology

## 2022-06-28 DIAGNOSIS — R590 Localized enlarged lymph nodes: Secondary | ICD-10-CM

## 2022-06-28 DIAGNOSIS — N6324 Unspecified lump in the left breast, lower inner quadrant: Secondary | ICD-10-CM | POA: Diagnosis not present

## 2022-06-28 DIAGNOSIS — Z1231 Encounter for screening mammogram for malignant neoplasm of breast: Secondary | ICD-10-CM

## 2022-06-28 DIAGNOSIS — R92333 Mammographic heterogeneous density, bilateral breasts: Secondary | ICD-10-CM | POA: Diagnosis not present

## 2022-06-28 DIAGNOSIS — N6322 Unspecified lump in the left breast, upper inner quadrant: Secondary | ICD-10-CM | POA: Diagnosis not present

## 2022-07-11 ENCOUNTER — Encounter: Payer: BC Managed Care – PPO | Admitting: Dermatology

## 2022-07-18 ENCOUNTER — Ambulatory Visit: Payer: BC Managed Care – PPO | Admitting: Dermatology

## 2022-07-18 VITALS — BP 127/89 | HR 84

## 2022-07-18 DIAGNOSIS — Z86018 Personal history of other benign neoplasm: Secondary | ICD-10-CM

## 2022-07-18 DIAGNOSIS — D2262 Melanocytic nevi of left upper limb, including shoulder: Secondary | ICD-10-CM

## 2022-07-18 DIAGNOSIS — D229 Melanocytic nevi, unspecified: Secondary | ICD-10-CM

## 2022-07-18 DIAGNOSIS — I781 Nevus, non-neoplastic: Secondary | ICD-10-CM

## 2022-07-18 DIAGNOSIS — Z1283 Encounter for screening for malignant neoplasm of skin: Secondary | ICD-10-CM

## 2022-07-18 DIAGNOSIS — L578 Other skin changes due to chronic exposure to nonionizing radiation: Secondary | ICD-10-CM

## 2022-07-18 DIAGNOSIS — D225 Melanocytic nevi of trunk: Secondary | ICD-10-CM

## 2022-07-18 DIAGNOSIS — L719 Rosacea, unspecified: Secondary | ICD-10-CM

## 2022-07-18 DIAGNOSIS — L821 Other seborrheic keratosis: Secondary | ICD-10-CM

## 2022-07-18 DIAGNOSIS — D2272 Melanocytic nevi of left lower limb, including hip: Secondary | ICD-10-CM

## 2022-07-18 DIAGNOSIS — X32XXXA Exposure to sunlight, initial encounter: Secondary | ICD-10-CM

## 2022-07-18 DIAGNOSIS — L814 Other melanin hyperpigmentation: Secondary | ICD-10-CM | POA: Diagnosis not present

## 2022-07-18 DIAGNOSIS — L309 Dermatitis, unspecified: Secondary | ICD-10-CM

## 2022-07-18 DIAGNOSIS — L820 Inflamed seborrheic keratosis: Secondary | ICD-10-CM

## 2022-07-18 DIAGNOSIS — W908XXA Exposure to other nonionizing radiation, initial encounter: Secondary | ICD-10-CM

## 2022-07-18 NOTE — Progress Notes (Signed)
Follow-Up Visit   Subjective  Eileen Garcia is a 42 y.o. female who presents for the following: Skin Cancer Screening and Full Body Skin Exam  The patient presents for Total-Body Skin Exam (TBSE) for skin cancer screening and mole check. The patient has spots, moles and lesions to be evaluated, some may be new or changing. She has a spot in the groin for several months that is growing and irritated, she would like removed. History of an itchy rash on the chest. Redness on face.  She switched from [place] to [place] soap, and area has improved now. Patient with Sjog[place].    The following portions of the chart were reviewed this encounter and updated as appropriate: medications, allergies, medical history  Review of Systems:  No other skin or systemic complaints except as noted in HPI or Assessment and Plan.  Objective  Well appearing patient in no apparent distress; mood and affect are within normal limits.  A full examination was performed including scalp, head, eyes, ears, nose, lips, neck, chest, axillae, abdomen, back, buttocks, bilateral upper extremities, bilateral lower extremities, hands, feet, fingers, toes, fingernails, and toenails. All findings within normal limits unless otherwise noted below.   Relevant physical exam findings are noted in the Assessment and Plan.  superior vulva Erythematous stuck-on, waxy papule or plaque    Assessment & Plan   LENTIGINES, SEBORRHEIC KERATOSES, HEMANGIOMAS - Benign normal skin lesions - Benign-appearing - Call for any changes  MELANOCYTIC NEVI - Tan-brown and/or pink-flesh-colored symmetric macules and papules  - 4 x 2mm two toned brown macule, darker inferior L upper arm - stable compared to previous photo    - 7mm brown papules x 2 at right lower back - stable compared to previous photo  - 1.5 mm gray brown macule on the left upper knee  - Benign appearing on exam today - Observation - Call clinic for new or changing moles -  Recommend daily use of broad spectrum spf 30+ sunscreen to sun-exposed areas.   Benign nevus Left Mid Back 2.48mm brown macule within round white scar Biopsy proven benign, 2019. With recurrence. Observation.   ACTINIC DAMAGE - Chronic condition, secondary to cumulative UV/sun exposure - diffuse scaly erythematous macules with underlying dyspigmentation - Recommend daily broad spectrum sunscreen SPF 30+ to sun-exposed areas, reapply every 2 hours as needed.  - Staying in the shade or wearing long sleeves, sun glasses (UVA+UVB protection) and wide brim hats (4-inch brim around the entire circumference of the hat) are also recommended for sun protection.  - Call for new or changing lesions.  History of Dysplastic Nevi - No evidence of recurrence today - Recommend regular full body skin exams - Recommend daily broad spectrum sunscreen SPF 30+ to sun-exposed areas, reapply every 2 hours as needed.  - Call if any new or changing lesions are noted between office visits  SKIN CANCER SCREENING PERFORMED TODAY.  CONGENITAL NEVUS Exam:  1.1cm speckled brown macule at sacrum, present for years with no change per pt, stable compared to previous photo.   Treatment Plan: Benign appearing on exam today. Recommend observation. Call clinic for new or changing moles. Recommend daily use of broad spectrum spf 30+ sunscreen to sun-exposed areas.   Inflamed seborrheic keratosis superior vulva  Symptomatic, irritating, patient would like treated.  Destruction of lesion - superior vulva  Destruction method: cryotherapy   Informed consent: discussed and consent obtained   Lesion destroyed using liquid nitrogen: Yes   Region frozen until ice ball  extended beyond lesion: Yes   Outcome: patient tolerated procedure well with no complications   Post-procedure details: wound care instructions given   Additional details:  Prior to procedure, discussed risks of blister formation, small wound, skin  dyspigmentation, or rare scar following cryotherapy. Recommend Vaseline ointment to treated areas while healing.   TELANGIECTASIA Exam: dilated blood vessels of the chest  Treatment Plan: Benign appearing on exam Call for changes  ROSACEA Exam Mid face erythema with telangiectasias; erythema of the upper forehead   Chronic and persistent condition with duration or expected duration over one year. Condition is bothersome/symptomatic for patient. Currently flared.   Rosacea is a chronic progressive skin condition usually affecting the face of adults, causing redness and/or acne bumps. It is treatable but not curable. It sometimes affects the eyes (ocular rosacea) as well. It may respond to topical and/or systemic medication and can flare with stress, sun exposure, alcohol, exercise, topical steroids (including hydrocortisone/cortisone 10) and some foods.  Daily application of broad spectrum spf 30+ sunscreen to face is recommended to reduce flares.   Treatment Plan Not bothersome to pt, no treatment. Patient will call if worsens.    DERMATITIS  Exam: Light pink scaly papules on the bilateral elbows.  Treatment Plan: Discussed topical steroid to use to clear and as needed for itch. Patient defers since has OTC cream she likes, will call if worsens.   Return in about 1 year (around 07/18/2023) for TBSE, Hx Dysplastic Nevus.  ICherlyn Labella, CMA, am acting as scribe for Willeen Niece, MD .   Documentation: I have reviewed the above documentation for accuracy and completeness, and I agree with the above.  Willeen Niece, MD

## 2022-07-18 NOTE — Patient Instructions (Addendum)
Cryotherapy Aftercare  Wash gently with soap and water everyday.   Apply Vaseline and Band-Aid daily until healed.   Seborrheic Keratosis  What causes seborrheic keratoses? Seborrheic keratoses are harmless, common skin growths that first appear during adult life.  As time goes by, more growths appear.  Some people may develop a large number of them.  Seborrheic keratoses appear on both covered and uncovered body parts.  They are not caused by sunlight.  The tendency to develop seborrheic keratoses can be inherited.  They vary in color from skin-colored to gray, brown, or even black.  They can be either smooth or have a rough, warty surface.   Seborrheic keratoses are superficial and look as if they were stuck on the skin.  Under the microscope this type of keratosis looks like layers upon layers of skin.  That is why at times the top layer may seem to fall off, but the rest of the growth remains and re-grows.    Treatment Seborrheic keratoses do not need to be treated, but can easily be removed in the office.  Seborrheic keratoses often cause symptoms when they rub on clothing or jewelry.  Lesions can be in the way of shaving.  If they become inflamed, they can cause itching, soreness, or burning.  Removal of a seborrheic keratosis can be accomplished by freezing, burning, or surgery. If any spot bleeds, scabs, or grows rapidly, please return to have it checked, as these can be an indication of a skin cancer.    Melanoma ABCDEs  Melanoma is the most dangerous type of skin cancer, and is the leading cause of death from skin disease.  You are more likely to develop melanoma if you: Have light-colored skin, light-colored eyes, or red or blond hair Spend a lot of time in the sun Tan regularly, either outdoors or in a tanning bed Have had blistering sunburns, especially during childhood Have a close family member who has had a melanoma Have atypical moles or large birthmarks  Early detection  of melanoma is key since treatment is typically straightforward and cure rates are extremely high if we catch it early.   The first sign of melanoma is often a change in a mole or a new dark spot.  The ABCDE system is a way of remembering the signs of melanoma.  A for asymmetry:  The two halves do not match. B for border:  The edges of the growth are irregular. C for color:  A mixture of colors are present instead of an even brown color. D for diameter:  Melanomas are usually (but not always) greater than 6mm - the size of a pencil eraser. E for evolution:  The spot keeps changing in size, shape, and color.  Please check your skin once per month between visits. You can use a small mirror in front and a large mirror behind you to keep an eye on the back side or your body.   If you see any new or changing lesions before your next follow-up, please call to schedule a visit.  Please continue daily skin protection including broad spectrum sunscreen SPF 30+ to sun-exposed areas, reapplying every 2 hours as needed when you're outdoors.   Staying in the shade or wearing long sleeves, sun glasses (UVA+UVB protection) and wide brim hats (4-inch brim around the entire circumference of the hat) are also recommended for sun protection.    Due to recent changes in healthcare laws, you may see results of your pathology and/or   laboratory studies on MyChart before the doctors have had a chance to review them. We understand that in some cases there may be results that are confusing or concerning to you. Please understand that not all results are received at the same time and often the doctors may need to interpret multiple results in order to provide you with the best plan of care or course of treatment. Therefore, we ask that you please give us 2 business days to thoroughly review all your results before contacting the office for clarification. Should we see a critical lab result, you will be contacted  sooner.   If You Need Anything After Your Visit  If you have any questions or concerns for your doctor, please call our main line at [phone] and press option 4 to reach your doctor's medical assistant. If no one answers, please leave a voicemail as directed and we will return your call as soon as possible. Messages left after 4 pm will be answered the following business day.   You may also send us a message via MyChart. We typically respond to MyChart messages within 1-2 business days.  For prescription refills, please ask your pharmacy to contact our office. Our fax number is [phone].  If you have an urgent issue when the clinic is closed that cannot wait until the next business day, you can page your doctor at the number below.    Please note that while we do our best to be available for urgent issues outside of office hours, we are not available 24/7.   If you have an urgent issue and are unable to reach us, you may choose to seek medical care at your doctor's office, retail clinic, urgent care center, or emergency room.  If you have a medical emergency, please immediately call 911 or go to the emergency department.  Pager Numbers  - Dr. Kowalski: [phone]  - Dr. Moye: [phone]  - Dr. Stewart: [phone]  In the event of inclement weather, please call our main line at [phone] for an update on the status of any delays or closures.  Dermatology Medication Tips: Please keep the boxes that topical medications come in in order to help keep track of the instructions about where and how to use these. Pharmacies typically print the medication instructions only on the boxes and not directly on the medication tubes.   If your medication is too expensive, please contact our office at [phone] option 4 or send us a message through MyChart.   We are unable to tell what your co-pay for medications will be in advance as this is different depending on your insurance  coverage. However, we may be able to find a substitute medication at lower cost or fill out paperwork to get insurance to cover a needed medication.   If a prior authorization is required to get your medication covered by your insurance company, please allow us 1-2 business days to complete this process.  Drug prices often vary depending on where the prescription is filled and some pharmacies may offer cheaper prices.  The website www.goodrx.com contains coupons for medications through different pharmacies. The prices here do not account for what the cost may be with help from insurance (it may be cheaper with your insurance), but the website can give you the price if you did not use any insurance.  - You can print the associated coupon and take it with your prescription to the pharmacy.  - You may also stop by our office   during regular business hours and pick up a GoodRx coupon card.  - If you need your prescription sent electronically to a different pharmacy, notify our office through [facility] MyChart or by phone at [phone] option 4.     Si Usted Necesita Algo Despus de Su Visita  Tambin puede enviarnos un mensaje a travs de MyChart. Por lo general respondemos a los mensajes de MyChart en el transcurso de 1 a 2 das hbiles.  Para renovar recetas, por favor pida a su farmacia que se ponga en contacto con nuestra oficina. Nuestro nmero de fax es el [phone].  Si tiene un asunto urgente cuando la clnica est cerrada y que no puede esperar hasta el siguiente da hbil, puede llamar/localizar a su doctor(a) al nmero que aparece a continuacin.   Por favor, tenga en cuenta que aunque hacemos todo lo posible para estar disponibles para asuntos urgentes fuera del horario de oficina, no estamos disponibles las 24 horas del da, los 7 das de la semana.   Si tiene un problema urgente y no puede comunicarse con nosotros, puede optar por buscar atencin mdica  en el consultorio de  su doctor(a), en una clnica privada, en un centro de atencin urgente o en una sala de emergencias.  Si tiene una emergencia mdica, por favor llame inmediatamente al 911 o vaya a la sala de emergencias.  Nmeros de bper  - Dr. Kowalski: [phone]  - Dra. Moye: [phone]  - Dra. Stewart: [phone]  En caso de inclemencias del tiempo, por favor llame a nuestra lnea principal al [phone] para una actualizacin sobre el estado de cualquier retraso o cierre.  Consejos para la medicacin en dermatologa: Por favor, guarde las cajas en las que vienen los medicamentos de uso tpico para ayudarle a seguir las instrucciones sobre dnde y cmo usarlos. Las farmacias generalmente imprimen las instrucciones del medicamento slo en las cajas y no directamente en los tubos del medicamento.   Si su medicamento es muy caro, por favor, pngase en contacto con nuestra oficina llamando al [phone] y presione la opcin 4 o envenos un mensaje a travs de MyChart.   No podemos decirle cul ser su copago por los medicamentos por adelantado ya que esto es diferente dependiendo de la cobertura de su seguro. Sin embargo, es posible que podamos encontrar un medicamento sustituto a menor costo o llenar un formulario para que el seguro cubra el medicamento que se considera necesario.   Si se requiere una autorizacin previa para que su compaa de seguros cubra su medicamento, por favor permtanos de 1 a 2 das hbiles para completar este proceso.  Los precios de los medicamentos varan con frecuencia dependiendo del lugar de dnde se surte la receta y alguna farmacias pueden ofrecer precios ms baratos.  El sitio web www.goodrx.com tiene cupones para medicamentos de diferentes farmacias. Los precios aqu no tienen en cuenta lo que podra costar con la ayuda del seguro (puede ser ms barato con su seguro), pero el sitio web puede darle el precio si no utiliz ningn seguro.  - Puede imprimir el  cupn correspondiente y llevarlo con su receta a la farmacia.  - Tambin puede pasar por nuestra oficina durante el horario de atencin regular y recoger una tarjeta de cupones de GoodRx.  - Si necesita que su receta se enve electrnicamente a una farmacia diferente, informe a nuestra oficina a travs de MyChart de [facility] o por telfono llamando al [phone] y presione la opcin 4.  

## 2022-08-07 ENCOUNTER — Other Ambulatory Visit: Payer: Self-pay | Admitting: Obstetrics and Gynecology

## 2022-09-08 ENCOUNTER — Other Ambulatory Visit: Payer: Self-pay | Admitting: Obstetrics and Gynecology

## 2022-09-12 ENCOUNTER — Encounter: Payer: Self-pay | Admitting: Obstetrics and Gynecology

## 2022-09-13 ENCOUNTER — Other Ambulatory Visit: Payer: Self-pay | Admitting: Certified Nurse Midwife

## 2022-09-13 MED ORDER — VALACYCLOVIR HCL 1 G PO TABS: 1000.0000 mg | ORAL_TABLET | Freq: Every day | ORAL | 1 refills | Status: DC

## 2022-09-16 NOTE — Progress Notes (Unsigned)
PCP:  Pcp, No   No chief complaint on file.    HPI:      [name] is a 42 y.o. [id number] who LMP was No LMP recorded. (Menstrual status: IUD)., presents today for her annual examination.  Her menses are every few wks to monthly with Kyleena, light spotting for a few days, mild dysmen, improved with tylenol. Did POPs in past for better cycle control with IUD, but no longer needed/prefers not to be on them.  Skyla placed 2017 due to anovulatory bleeding/menorrhagia with Dr. Luella Cook. Suspicion of endometriosis given hx of pelvic pain. Failed mult OCPs, ortho evra and depo. Hx of migraines with aura.  Sex activity: single partner, contraception - IUD. Kyleena placed 07/08/18. Pt with long hx of dyspareunia. Last Pap: 02/11/18  Results were: no abnormalities /neg HPV DNA  Hx of STDs: none  Last mammo: 06/28/22 Results were no abnormalities, repeat in 12 months.  There is no FH of breast cancer. There is a FH of uterine vs ovarian cancer in PGM (no chemo/rad done), genetic testing not done. The patient does do self-breast exams. Had painful LT breast mass 5/23; had neg mammo and u/s at area of tenderness. Did note RT axillary LAN, s/p bx yesterday, awaiting results; also with indeterminate LT breast mass inner quadrant needing 6 mos f/u imaging 11/23. Pt declines breast exam today  Tobacco use: 1/2-3/4 ppd; not ready to quit Alcohol use: none No drug use.  Exercise: moderately active  She does not get adequate calcium or Vitamin D in her diet. Many vitamins make her nauseated.    Past Medical History:  Diagnosis Date   Amenorrhea    Anxiety    Asthma    allergy induced   Atypical mole 12/17/2017   Atypical combined nevus, Right upper arm, excision   Complication of anesthesia    pt states she was given sedation prior to her nasal surgery in preop, then pt woke up on her way back to OR-after pts surgery pt was very agitated and yelling    Dysplastic nevus 12/03/2017   L mid upper  back lateral , moderate. Limited margins free   Family history of adverse reaction to anesthesia    dad-vomits and very emotional after surgery   GERD (gastroesophageal reflux disease)    no meds   Headache    History of kidney stones 04/2018   Lumbar back pain 07/23/2018   Menorrhagia    Migraine with aura    Missed abortion    Pelvic pain    Polycystic ovaries    PONV (postoperative nausea and vomiting)     Past Surgical History:  Procedure Laterality Date   ACHILLES TENDON SURGERY Left 07/26/2018   Procedure: ACHILLES TENDON REPAIR SECONDARY;  Surgeon: Gwyneth Revels, DPM;  Location: [facility] ORS;  Service: Podiatry;  Laterality: Left;   BREAST BIOPSY Left 07/01/2021   LEFT u/s guided bx-- Reactive lymphoid tissue with acute inflammation and associated necrosis.   NASAL SEPTUM SURGERY  2012-2013   with Dr Elenore Rota   TYMPANOSTOMY TUBE PLACEMENT     3-4 different surgeries   WISDOM TOOTH EXTRACTION      Family History  Problem Relation Age of Onset   Hypertension Father    Migraines Paternal Grandmother    Diabetes Paternal Grandmother        type 1   Hypertension Paternal Grandmother    Uterine cancer Paternal Grandmother 60       vs ovar--no chemo/rad  done   Breast cancer Neg Hx     Social History   Socioeconomic History   Marital status: Married    Spouse name: Not on file   Number of children: Not on file   Years of education: Not on file   Highest education level: Not on file  Occupational History   Not on file  Tobacco Use   Smoking status: Former    Current packs/day: 0.00    Average packs/day: 0.5 packs/day for 10.0 years (5.0 ttl pk-yrs)    Types: Cigarettes    Start date: 07/23/1998    Quit date: 07/22/2008    Years since quitting: 14.1   Smokeless tobacco: Never  Vaping Use   Vaping status: Never Used  Substance and Sexual Activity   Alcohol use: Yes    Comment: rare   Drug use: No   Sexual activity: Yes    Birth control/protection: I.U.D.     Comment: Skyla  Other Topics Concern   Not on file  Social History Narrative   Not on file   Social Determinants of Health   Financial Resource Strain: Not on file  Food Insecurity: Not on file  Transportation Needs: Not on file  Physical Activity: Not on file  Stress: Not on file  Social Connections: Not on file  Intimate Partner Violence: Not on file    No outpatient medications have been marked as taking for the 09/18/22 encounter (Appointment) with Alphons Burgert, Ilona Sorrel, PA-C.     ROS:  Review of Systems  Constitutional:  Negative for fatigue, fever and unexpected weight change.  Respiratory:  Negative for cough, shortness of breath and wheezing.   Cardiovascular:  Negative for chest pain, palpitations and leg swelling.  Gastrointestinal:  Negative for blood in stool, constipation, diarrhea, nausea and vomiting.  Endocrine: Negative for cold intolerance, heat intolerance and polyuria.  Genitourinary:  Negative for dyspareunia, dysuria, flank pain, frequency, genital sores, hematuria, menstrual problem, pelvic pain, urgency, vaginal bleeding, vaginal discharge and vaginal pain.  Musculoskeletal:  Negative for back pain, joint swelling and myalgias.  Skin:  Negative for rash.  Neurological:  Negative for dizziness, syncope, light-headedness, numbness and headaches.  Hematological:  Negative for adenopathy.  Psychiatric/Behavioral:  Negative for agitation, confusion, sleep disturbance and suicidal ideas. The patient is not nervous/anxious.      Objective: There were no vitals taken for this visit.  Physical Exam Constitutional:      Appearance: She is well-developed.  Genitourinary:     Vulva normal.     Genitourinary Comments: BREAST EXAM DECLINED TODAY BY PT DUE TO RECENT IMAGING/BX     Right Labia: No rash, tenderness or lesions.    Left Labia: No tenderness, lesions or rash.    No vaginal discharge, erythema or tenderness.      Right Adnexa: not tender and no mass  present.    Left Adnexa: not tender and no mass present.    No cervical friability or polyp.     IUD strings visualized.     Uterus is not enlarged or tender.  Neck:     Thyroid: No thyromegaly.  Cardiovascular:     Rate and Rhythm: Normal rate and regular rhythm.     Heart sounds: Normal heart sounds. No murmur heard. Pulmonary:     Effort: Pulmonary effort is normal.     Breath sounds: Normal breath sounds.  Abdominal:     Palpations: Abdomen is soft.     Tenderness: There is no abdominal tenderness.  There is no guarding or rebound.  Musculoskeletal:        General: Normal range of motion.     Cervical back: Normal range of motion.  Lymphadenopathy:     Cervical: No cervical adenopathy.  Neurological:     General: No focal deficit present.     Mental Status: She is alert and oriented to person, place, and time.     Cranial Nerves: No cranial nerve deficit.  Skin:    General: Skin is warm and dry.  Psychiatric:        Mood and Affect: Mood normal.        Behavior: Behavior normal.        Thought Content: Thought content normal.        Judgment: Judgment normal.  Vitals reviewed.     Assessment/Plan: Encounter for annual routine gynecological examination  Encounter for routine checking of intrauterine contraceptive device (IUD)--IUD strings in cx os; due for replacement 6/25  Mass of left breast, unspecified quadrant - Plan: MM DIAG BREAST TOMO UNI LEFT, US BREAST LTD UNI LEFT INC AXILLA; pt to schedule f/u imaging for 11/23. Awaiting axillary bx results.     GYN counsel adequate intake of calcium and vitamin D, diet and exercise     F/U  No follow-ups on file.  Makhayla Mcmurry B. Sian Rockers, PA-C 09/16/2022 10:12 PM

## 2022-09-18 ENCOUNTER — Ambulatory Visit (INDEPENDENT_AMBULATORY_CARE_PROVIDER_SITE_OTHER): Payer: BC Managed Care – PPO | Admitting: Obstetrics and Gynecology

## 2022-09-18 ENCOUNTER — Encounter: Payer: Self-pay | Admitting: Obstetrics and Gynecology

## 2022-09-18 ENCOUNTER — Other Ambulatory Visit (HOSPITAL_COMMUNITY)
Admission: RE | Admit: 2022-09-18 | Discharge: 2022-09-18 | Disposition: A | Discharge status: Home / Self Care | Payer: BC Managed Care – PPO | Source: Ambulatory Visit | Attending: Obstetrics and Gynecology | Admitting: Obstetrics and Gynecology

## 2022-09-18 VITALS — BP 137/87 | HR 69 | Wt 190.3 lb

## 2022-09-18 DIAGNOSIS — Z01419 Encounter for gynecological examination (general) (routine) without abnormal findings: Secondary | ICD-10-CM

## 2022-09-18 DIAGNOSIS — Z1151 Encounter for screening for human papillomavirus (HPV): Secondary | ICD-10-CM | POA: Insufficient documentation

## 2022-09-18 DIAGNOSIS — Z30431 Encounter for routine checking of intrauterine contraceptive device: Secondary | ICD-10-CM

## 2022-09-18 DIAGNOSIS — Z01411 Encounter for gynecological examination (general) (routine) with abnormal findings: Secondary | ICD-10-CM | POA: Diagnosis not present

## 2022-09-18 DIAGNOSIS — Z1231 Encounter for screening mammogram for malignant neoplasm of breast: Secondary | ICD-10-CM

## 2022-09-18 DIAGNOSIS — R768 Other specified abnormal immunological findings in serum: Secondary | ICD-10-CM

## 2022-09-18 DIAGNOSIS — B001 Herpesviral vesicular dermatitis: Secondary | ICD-10-CM

## 2022-09-18 DIAGNOSIS — Z124 Encounter for screening for malignant neoplasm of cervix: Secondary | ICD-10-CM | POA: Diagnosis not present

## 2022-09-18 DIAGNOSIS — N946 Dysmenorrhea, unspecified: Secondary | ICD-10-CM

## 2022-09-18 DIAGNOSIS — N921 Excessive and frequent menstruation with irregular cycle: Secondary | ICD-10-CM

## 2022-09-18 DIAGNOSIS — Z975 Presence of (intrauterine) contraceptive device: Secondary | ICD-10-CM

## 2022-09-18 MED ORDER — VALACYCLOVIR HCL 1 G PO TABS
1000.0000 mg | ORAL_TABLET | Freq: Every day | ORAL | 1 refills | Status: DC
Start: 2022-09-18 — End: 2023-07-31

## 2022-09-18 NOTE — Patient Instructions (Signed)
I value your feedback and you entrusting us with your care. If you get a [facility] patient survey, I would appreciate you taking the time to let us know about your experience today. Thank you! ? ? ?

## 2022-09-20 DIAGNOSIS — H6983 Other specified disorders of Eustachian tube, bilateral: Secondary | ICD-10-CM | POA: Diagnosis not present

## 2022-09-20 DIAGNOSIS — H900 Conductive hearing loss, bilateral: Secondary | ICD-10-CM | POA: Diagnosis not present

## 2022-10-02 ENCOUNTER — Other Ambulatory Visit: Payer: Self-pay | Admitting: Otolaryngology

## 2022-10-02 ENCOUNTER — Ambulatory Visit: Payer: BC Managed Care – PPO

## 2022-10-02 DIAGNOSIS — N946 Dysmenorrhea, unspecified: Secondary | ICD-10-CM | POA: Diagnosis not present

## 2022-10-02 DIAGNOSIS — H6982 Other specified disorders of Eustachian tube, left ear: Secondary | ICD-10-CM | POA: Diagnosis not present

## 2022-10-02 DIAGNOSIS — Z975 Presence of (intrauterine) contraceptive device: Secondary | ICD-10-CM

## 2022-10-02 DIAGNOSIS — N921 Excessive and frequent menstruation with irregular cycle: Secondary | ICD-10-CM

## 2022-10-02 DIAGNOSIS — H900 Conductive hearing loss, bilateral: Secondary | ICD-10-CM | POA: Diagnosis not present

## 2022-10-03 ENCOUNTER — Other Ambulatory Visit: Payer: Self-pay

## 2022-10-03 MED ORDER — CIPROFLOXACIN-DEXAMETHASONE 0.3-0.1 % OT SUSP
4.0000 [drp] | Freq: Two times a day (BID) | OTIC | 0 refills | Status: DC
  Filled 2022-10-03: qty 7.5, 5d supply, fill #0

## 2022-10-04 ENCOUNTER — Encounter: Payer: Self-pay | Admitting: Otolaryngology

## 2022-10-05 NOTE — Discharge Instructions (Signed)
 [facility] DISCHARGE INSTRUCTIONS FOR MYRINGOTOMY AND TUBE INSERTION  [facility] AUSTIN ROSE, M.D.  Diet:   After surgery, the patient should take only liquids and foods as tolerated.  The patient may then have a regular diet after the effects of anesthesia have worn off, usually about four to six hours after surgery.  Activities:   The patient should rest until the effects of anesthesia have worn off.  After this, there are no restrictions on the normal daily activities.  Medications:   You will be given a prescription for antibiotic drops to be used in the ears postoperatively.  It is recommended to use 5 drops 2 times a day for 5 days, then the drops should be saved for possible future use.  The tubes should not cause any discomfort to the patient, but if there is any question, Tylenol should be given according to the instructions for the age of the patient.  Other medications should be continued normally.  Precautions:   Should there be recurrent drainage after the tubes are placed, the drops should be used for approximately 3-4 days.  If it does not clear, you should call the ENT office.  Earplugs:   Earplugs are only needed for those who are going to be submerged under water.  When taking a bath or shower and using a cup or showerhead to rinse hair, it is not necessary to wear earplugs.  These come in a variety of fashions, all of which can be obtained at our office.  However, if one is not able to come by the office, then silicone plugs can be found at most pharmacies.  It is not advised to stick anything in the ear that is not approved as an earplug.  Silly putty is not to be used as an earplug.  Swimming is allowed in patients after ear tubes are inserted, however, they must wear earplugs if they are going to be submerged under water.  For those children who are going to be swimming a lot, it is recommended to use a fitted ear mold, which can be made by  our audiologist.  If discharge is noticed from the ears, this most likely represents an ear infection.  We would recommend getting your eardrops and using them as indicated above.  If it does not clear, then you should call the ENT office.  For follow up, the patient should return to the ENT office three weeks postoperatively and then every six months as required by the doctor.

## 2022-10-06 ENCOUNTER — Telehealth: Payer: Self-pay | Admitting: Obstetrics and Gynecology

## 2022-10-06 NOTE — Telephone Encounter (Signed)
LM with normal GYN u/s results. IUD in correct location. Can add POPs again for BTB with IUD. Asked pt to call me back with preference.

## 2022-10-06 NOTE — Telephone Encounter (Signed)
The patient has called due to missed call from [place] Cop[place] to go over her ultrasound results. Please advise? [phone]

## 2022-10-06 NOTE — Telephone Encounter (Signed)
Spoke with pt. Aware of GYN u/s results. Discussed adding POPs vs trying orilissa for pain/bleeding. Pt to consider and f/u with decision.

## 2022-10-11 NOTE — H&P (Signed)
This is a 42 year old female who is being seen for a chief complaint of ear pain located in the left ear. She has ear pain that is sharp and throbbing. She has ear pain that is recurrent. She has ear pain that is unchanged and mild in severity. The pain is noticed all the time. She has associated dizziness and ear itching (left ear), but no drainage, no fullness, and no tinnitus. The ear pain has been present for 8 days. The ear pain developed suddenly. Patient states she is still having left ear pain. Patient states it feels like there is fluid in her left ear. Patient states that her dizziness has gotten better since she called on Friday. Patient states is worried that she is going to lose the hearing in her left ear as she has in the past. Patient states she did complete the Medrol dose pack on last Tuesday or Wednesday. Patient states she has noticed that the pain is not as intense and can be intermittent. ---- Ms. Aliberti completed the oral steroid course and 3-4 days of Afrin. She has started the Flonase daily, but continues to have significant bilateral ear discomfort, pressure, hearing loss and aural fullness. Exam with obvious TM retraction bilaterally, though todays audiogram wnl. Vitals: Date Taken By B.P. Pulse Resp. O2 Sat. Temp. Ht. Wt. BMI BSA 10/02/22 09:04 Kennieth Rad 97.3 F 60.0 in* 185.0 lbs* 36.1 1.8 FiO2 * Patient Reported Exam: An Otolaryngologic exam was performed Otolaryngologic exam External Ears: external ear examination of normal size and morphology without traumatic or congenital deformity AD, external ear examination of normal size and morphology without traumatic or congenital deformity AS. External ear canal AD: Middle ear effusion - no perf or acute infection External ear canal AS: Middle ear effusion - no perf or acute infection Tympanic membranes: AD TM: tympanosclerosis; Obvious TM retraction bilaterally ; AS TM: tympanosclerosis; Obvious  TM retraction bilaterally ; External Nose: Nasal dorsum deviated to the right due to trauma. Right Nasal Cavity: right intranasal examination normal without turbinate hypertrophy, masses, septal deformity or synechiae Left nasal cavity: left intranasal examination normal without turbinate hypertrophy, masses, septal deformity or synechiae Lips, Teeth, Gums: normal lip morphology and anatomy, class I occlusion, no dental abnormalities Visit Note - October 02, 2022 RAEGAN, HAGGETT MRN: [id number] Phone: [phone] DOB: May 30, 1980 Sex: Female PMS ID: [id number] Adron Bene (Primary Provider) [facility] Under) Page 2 [phone] Work [phone] Fax [facility] - [place] [address] [place], [place] [address] Provider reviewed on Oct 02, 2022. A focused review of systems was performed including ENT and Mouth and Musculoskeletal and was notable for otalgia and tinnitus. No Dysphagia, No Hearing Loss, No Loss Of Smell, No Otorrhea, No Pnd, No Throat Pain, No Tongue Swelling, No Neck Mass, And No Neck Pain. Family History Reviewed and no changes noted October 02, 2022. Family history: Diabetes mellitus (situation) - Grandmother Family history of alcoholism (situation) - Grandfather - Grandmother Family history: Epilepsy (situation) - Mother Family history: Glaucoma (situation) - Grandmother Family history: Triglyceride high (situation) - Mother Family history: Hypertension (situation) - Father Medical History Reviewed and no changes noted October 02, 2022. Asthma - H/O: asthma Disorder of female genital system - Other: PCOS Endometriosis (clinical) Migraine Obstructive sleep apnea syndrome Other: Sjgren's syndrome Surgical History Reviewed and no changes noted October 02, 2022. None  Oral cavity/Oropharynx: normal hard and soft palate, tongue, pharyngeal walls, buccal mucosa, floor of mouth, and tonsils  Head  Inspection: Normal head inspection with normal head shape, without masses or concerning lesions.  Head Palpation: Normal head inspection without masses, palpable deformities, or concerning lesions. Salivary: Right parotid mass, 1.5 x 2 cm, quite superficial - no erythema or tenderness. Facial nerve intact. Neck: normal neck examination without skin masses, tenderness or crepitus  Thyroid: normal thyroid examination without masses or nodules Respiratory Effort: normal respiratory effort without labored breathing or accessory muscle use  Neck Lymph Node: normal lymphatic exam without lymphadenopathy in cranial or cervical regions Neuro - Cranial Nerves: Cranial nerves II-XII intact.  Chest - clear to auscultation bilaterally C/V - regular rate and rhythm without murmur    Appearance: well developed and nourished Communication: normal vocal quality and ability to communicate Orientation: Alert and oriented to person, place, time. Mood:mood and affect well-adjusted, pleasant and cooperative, appropriate for clinical and encounter circumstances Impression/Plan: Eustachian tube dysfunction, Left Other specified disorders of Eustachian tube, left ear (Z61.09) Status: Inadequately Controlled Plan: Counseling - Eustachian tube dysfunction. Please refer to the education handout for detailed counseling. Plan: Audiogram order (same day visit). Audiologic testing was ordered due to the patient's complaint. Procedure: Indication Eustachian tube dysfunction, Left The test results were reviewed with the patient. Plan: Order for Surgery. Surgery scheduling order Surgeon: Reola Mosher Procedure(s): Bilateral myringotomy and tubes requiring general anesthesia; CPT: 69436 Estimated Time: 30 minutes. Post-op follow up in: 21 days Diagnosis codes: Eustachian tube dysfunction, Left H69.82 1. Visit Note - October 02, 2022 MELISANDE, DEVENNY MRN: [id number] Phone: [phone] DOB: 07/15/1980 Sex: Female PMS ID: [id number] Adron Bene (Primary Provider) [facility] Under) Page 3 [phone] Work [phone] Fax [facility] - [place] [address] [place], [place] [address] Target surgery date: 10/12/22. Assistant needed Anesthesia: general. Admission Status: outpatient. Provider: Adron Bene Perform at: [facility] Address: [address] [place], [place] [address] Fax: [phone] Priority: normal Plan: Treatment Regimen. Begin the following treatments: ** Recommend to OR for bilateral Duravent vs. T-tubes. anticipate [facility] OR and Dc home dame day. RTC - Dr. Okey Dupre at [facility] 6-8 weeks postop - no audiogram needed.. Conductive hearing loss, bilateral Conductive hearing loss, bilateral (H90.0) Status: Inadequately Controlled Plan: Counseling - Conductive hearing loss, Combined type. Please refer to the education handout for detailed counseling.

## 2022-10-12 ENCOUNTER — Other Ambulatory Visit: Payer: Self-pay

## 2022-10-12 ENCOUNTER — Ambulatory Visit
Admission: RE | Admit: 2022-10-12 | Discharge: 2022-10-12 | Disposition: A | Discharge status: Home / Self Care | Payer: BC Managed Care – PPO | Attending: Otolaryngology | Admitting: Otolaryngology

## 2022-10-12 ENCOUNTER — Ambulatory Visit: Payer: BC Managed Care – PPO | Admitting: Anesthesiology

## 2022-10-12 ENCOUNTER — Encounter: Payer: Self-pay | Admitting: Otolaryngology

## 2022-10-12 ENCOUNTER — Encounter
Admission: RE | Disposition: A | Discharge status: Home / Self Care | Payer: Self-pay | Source: Home / Self Care | Attending: Otolaryngology

## 2022-10-12 DIAGNOSIS — H6993 Unspecified Eustachian tube disorder, bilateral: Secondary | ICD-10-CM | POA: Diagnosis not present

## 2022-10-12 DIAGNOSIS — H6523 Chronic serous otitis media, bilateral: Secondary | ICD-10-CM | POA: Diagnosis not present

## 2022-10-12 DIAGNOSIS — H902 Conductive hearing loss, unspecified: Secondary | ICD-10-CM | POA: Insufficient documentation

## 2022-10-12 DIAGNOSIS — H6982 Other specified disorders of Eustachian tube, left ear: Secondary | ICD-10-CM | POA: Insufficient documentation

## 2022-10-12 DIAGNOSIS — F17210 Nicotine dependence, cigarettes, uncomplicated: Secondary | ICD-10-CM | POA: Diagnosis not present

## 2022-10-12 DIAGNOSIS — H6690 Otitis media, unspecified, unspecified ear: Secondary | ICD-10-CM | POA: Insufficient documentation

## 2022-10-12 DIAGNOSIS — J45909 Unspecified asthma, uncomplicated: Secondary | ICD-10-CM | POA: Diagnosis not present

## 2022-10-12 HISTORY — DX: Presence of spectacles and contact lenses: Z97.3

## 2022-10-12 HISTORY — DX: Sjogren syndrome, unspecified: M35.00

## 2022-10-12 HISTORY — PX: MYRINGOTOMY WITH TUBE PLACEMENT: SHX5663

## 2022-10-12 HISTORY — DX: Motion sickness, initial encounter: T75.3XXA

## 2022-10-12 LAB — POCT PREGNANCY, URINE: Preg Test, Ur: NEGATIVE

## 2022-10-12 SURGERY — MYRINGOTOMY WITH TUBE PLACEMENT
Anesthesia: General | Site: Ear | Laterality: Bilateral

## 2022-10-12 MED ORDER — DEXAMETHASONE SODIUM PHOSPHATE 4 MG/ML IJ SOLN
INTRAMUSCULAR | Status: DC | PRN
  Administered 2022-10-12: 4 mg via INTRAVENOUS

## 2022-10-12 MED ORDER — LACTATED RINGERS IV SOLN: INTRAVENOUS | Status: DC

## 2022-10-12 MED ORDER — MIDAZOLAM HCL 2 MG/2ML IJ SOLN
INTRAMUSCULAR | Status: DC | PRN
  Administered 2022-10-12: 2 mg via INTRAVENOUS

## 2022-10-12 MED ORDER — CIPROFLOXACIN-DEXAMETHASONE 0.3-0.1 % OT SUSP: 4.0000 [drp] | OTIC | Status: DC

## 2022-10-12 MED ORDER — ONDANSETRON HCL 4 MG/2ML IJ SOLN
INTRAMUSCULAR | Status: DC | PRN
Start: 2022-10-12 — End: 2022-10-12
  Administered 2022-10-12: 4 mg via INTRAVENOUS

## 2022-10-12 MED ORDER — OXYCODONE HCL 5 MG PO TABS
5.0000 mg | ORAL_TABLET | ORAL | Status: DC | PRN
  Administered 2022-10-12: 5 mg via ORAL

## 2022-10-12 MED ORDER — CIPROFLOXACIN-DEXAMETHASONE 0.3-0.1 % OT SUSP
OTIC | Status: DC | PRN
  Administered 2022-10-12 (×2): 4 [drp] via OTIC

## 2022-10-12 MED ORDER — PROPOFOL 10 MG/ML IV BOLUS
INTRAVENOUS | Status: DC | PRN
  Administered 2022-10-12: 50 mg via INTRAVENOUS
  Administered 2022-10-12: 30 mg via INTRAVENOUS
  Administered 2022-10-12: 40 mg via INTRAVENOUS
  Administered 2022-10-12: 20 mg via INTRAVENOUS
  Administered 2022-10-12: 60 mg via INTRAVENOUS

## 2022-10-12 SURGICAL SUPPLY — 9 items
BALL CTTN LRG ABS STRL LF (GAUZE/BANDAGES/DRESSINGS) ×1
BLADE MYR LANCE NRW W/HDL (BLADE) ×1 IMPLANT
CANISTER SUCT 1200ML W/VALVE (MISCELLANEOUS) ×1 IMPLANT
COTTONBALL LRG STERILE PKG (GAUZE/BANDAGES/DRESSINGS) ×1 IMPLANT
GLOVE SURG GAMMEX PI TX LF 7.5 (GLOVE) ×1 IMPLANT
STRAP BODY AND KNEE 60X3 (MISCELLANEOUS) ×1 IMPLANT
TOWEL OR 17X26 4PK STRL BLUE (TOWEL DISPOSABLE) ×1 IMPLANT
TUBE EAR T 1.27X4.5 GO LF (OTOLOGIC RELATED) IMPLANT
TUBING SUCTION CONN 0.25 STRL (TUBING) ×1 IMPLANT

## 2022-10-12 NOTE — Transfer of Care (Signed)
Immediate Anesthesia Transfer of Care Note  Patient: Eileen Garcia  Procedure(s) Performed: MYRINGOTOMY WITH TUBE PLACEMENT (Bilateral: Ear)  Patient Location: PACU  Anesthesia Type: General  Level of Consciousness: awake, alert  and patient cooperative  Airway and Oxygen Therapy: Patient Spontanous Breathing and Patient connected to supplemental oxygen  Post-op Assessment: Post-op Vital signs reviewed, Patient's Cardiovascular Status Stable, Respiratory Function Stable, Patent Airway and No signs of Nausea or vomiting  Post-op Vital Signs: Reviewed and stable  Complications: No notable events documented.

## 2022-10-12 NOTE — Anesthesia Postprocedure Evaluation (Signed)
Anesthesia Post Note  Patient: Eileen Garcia  Procedure(s) Performed: MYRINGOTOMY WITH TUBE PLACEMENT (Bilateral: Ear)  Patient location during evaluation: PACU Anesthesia Type: General Level of consciousness: awake and alert Pain management: pain level controlled Vital Signs Assessment: post-procedure vital signs reviewed and stable Respiratory status: spontaneous breathing, nonlabored ventilation, respiratory function stable and patient connected to nasal cannula oxygen Cardiovascular status: blood pressure returned to baseline and stable Postop Assessment: no apparent nausea or vomiting Anesthetic complications: no   No notable events documented.   Last Vitals:  Vitals:   10/12/22 0900 10/12/22 0922  BP: 134/89 135/79  Pulse: 71 76  Resp: 16 14  Temp:  36.7 C  SpO2: 98% 100%    Last Pain:  Vitals:   10/12/22 0922  TempSrc:   PainSc: 3                  Corinda Gubler

## 2022-10-12 NOTE — Interval H&P Note (Signed)
History and Physical Interval Note:  10/12/2022 7:25 AM  Eileen Garcia  has presented today for surgery, with the diagnosis of Eustachian tube dysfunction.  The various methods of treatment have been discussed with the patient and family. After consideration of risks, benefits and other options for treatment, the patient has consented to  Procedure(s): MYRINGOTOMY WITH TUBE PLACEMENT (Bilateral) as a surgical intervention.  The patient's history has been reviewed, patient examined, no change in status, stable for surgery.  I have reviewed the patient's chart and labs.  Questions were answered to the patient's satisfaction.     Reola Mosher S  No changes to H&P

## 2022-10-12 NOTE — Anesthesia Preprocedure Evaluation (Signed)
Anesthesia Evaluation  Patient identified by MRN, date of birth, ID band Patient awake    Reviewed: Allergy & Precautions, NPO status , Patient's Chart, lab work & pertinent test results  History of Anesthesia Complications (+) PONV and history of anesthetic complications  Airway Mallampati: II  TM Distance: >3 FB Neck ROM: Full    Dental no notable dental hx. (+) Teeth Intact   Pulmonary neg pulmonary ROS, neg sleep apnea, neg COPD, Current Smoker and Patient abstained from smoking.   Pulmonary exam normal breath sounds clear to auscultation       Cardiovascular Exercise Tolerance: Good METS(-) hypertension(-) CAD and (-) Past MI negative cardio ROS (-) dysrhythmias  Rhythm:Regular Rate:Normal - Systolic murmurs    Neuro/Psych  Headaches PSYCHIATRIC DISORDERS Anxiety        GI/Hepatic ,GERD  Controlled,,(+)     (-) substance abuse    Endo/Other  neg diabetes    Renal/GU negative Renal ROS     Musculoskeletal   Abdominal  (+) + obese  Peds  Hematology   Anesthesia Other Findings Past Medical History: No date: Amenorrhea No date: Anxiety No date: Asthma     Comment:  allergy induced 12/17/2017: Atypical mole     Comment:  Atypical combined nevus, Right upper arm, excision No date: Complication of anesthesia     Comment:  pt states she was given sedation prior to her nasal               surgery in preop, then pt woke up on her way back to               OR-after pts surgery pt was very agitated and yelling  12/03/2017: Dysplastic nevus     Comment:  L mid upper back lateral , moderate. Limited margins               free No date: Family history of adverse reaction to anesthesia     Comment:  dad-vomits and very emotional after surgery No date: GERD (gastroesophageal reflux disease)     Comment:  no meds No date: Headache 04/2018: History of kidney stones 07/23/2018: Lumbar back pain No date:  Menorrhagia No date: Migraine with aura No date: Missed abortion No date: Motion sickness     Comment:  cars No date: Pelvic pain No date: Polycystic ovaries No date: PONV (postoperative nausea and vomiting) No date: Sjogren's syndrome (HCC) No date: Wears contact lenses  Reproductive/Obstetrics                              Anesthesia Physical Anesthesia Plan  ASA: 2  Anesthesia Plan: General   Post-op Pain Management: Minimal or no pain anticipated   Induction: Intravenous  PONV Risk Score and Plan: 4 or greater and Ondansetron, Dexamethasone, Midazolam, TIVA and Propofol infusion  Airway Management Planned: Mask  Additional Equipment: None  Intra-op Plan:   Post-operative Plan: Extubation in OR  Informed Consent: I have reviewed the patients History and Physical, chart, labs and discussed the procedure including the risks, benefits and alternatives for the proposed anesthesia with the patient or authorized representative who has indicated his/her understanding and acceptance.     Dental advisory given  Plan Discussed with: CRNA and Surgeon  Anesthesia Plan Comments: (Discussed risks of anesthesia with patient, including PONV, sore throat, lip/dental/eye damage. Rare risks discussed as well, such as cardiorespiratory and neurological sequelae, and allergic reactions. Discussed the role  of CRNA in patient's perioperative care. Patient understands. Patient counseled on benefits of smoking cessation, and increased perioperative risks associated with continued smoking. )         Anesthesia Quick Evaluation

## 2022-10-12 NOTE — Op Note (Signed)
OPERATIVE REPORT  Attending Physician: Magnus Ivan. Okey Dupre, MD, MBA, FARS    Pediatric Otoloaryngology      Preoperative Diagnosis: Chronic eustachian tube dysfunction; retracted tympanic membranes bilaterally; associated conductive hearing loss; recurrent otitis media. Postoperative Diagnosis: Same.   Procedure(s) Performed:   1. Tympanostomy tube insertion  (CPT [id number]) - bilateral T-tubes 2. Use of operating microscope (CPT [phone])   Teaching Surgeon:  Magnus Ivan. Okey Dupre, MD, MBA, FARS Assistants: None Dictated   Anesthesia:  General with mask ventilation Specimens:  None Drains:  Bilateral PE tubes Estimated Blood Loss:  None  Operative Findings: Right ear:  Significant right tympanic membrane retraction Left ear:  Significant left tympanic membrane retraction  Procedure: After informed consent was obtained from the patient's parents, the patient was brought from the preoperative holding area to the operating room and placed supine on the operating room table. After smooth induction of general anesthesia with mask ventilation, a timeout was called and all parties were in agreement. The operating microscope was then brought into the field. A speculum was then used to visualize the right external auditory canal. Cerumen was removed and the tympanic membrane was ultimately visualized. A myringotomy was made in the anterior inferior portion of the tympanic membrane using a myringotomy knife.  Any middle ear fluid / effusion was removed with suction.  A pressure equalization tube was carefully placed and positioned with a pick. Floxin drops were instilled and a cotton swab was used for occlusion. Attention was then turned to completion of the procedure on the contralateral side, which was done in a similar fashion. A speculum was then used to visualize the external auditory canal. Cerumen was removed. The tympanic membrane was ultimately visualized and a myringotomy was made in the anterior inferior  portion of the tympanic membrane.  Any middle ear fluid / effusion was removed with suction.  A pressure equalization tube was carefully placed on the left and positioned with a pick. Floxin drops were then instilled and a cotton ball placed.  The patient's care was turned over to the Anesthesia team who successfully awakened the patient without event. The patient was transported to the PACU in stable condition. All instrument, sharp and lap counts were correct at the end of the case.   Post-Op Plan/Instructions: The patient will complete a course of topical antibiotic ear drops, 5 drops twice a day for 5 days bilaterally, and follow up with Dr. Okey Dupre at [facility] in 4-6 weeks.  Teaching Surgeon Attestation:  I was present and participated during all critical and key portions of the procedure(s) and immediately available throughout.  Please see the dictated operative report for details.    Magnus Ivan. Okey Dupre, MD, MBA, FARS Otolaryngology-Head & Neck Surgery

## 2022-10-14 ENCOUNTER — Encounter: Payer: Self-pay | Admitting: Otolaryngology

## 2022-11-03 DIAGNOSIS — H6983 Other specified disorders of Eustachian tube, bilateral: Secondary | ICD-10-CM | POA: Diagnosis not present

## 2022-11-03 DIAGNOSIS — H900 Conductive hearing loss, bilateral: Secondary | ICD-10-CM | POA: Diagnosis not present

## 2022-12-15 DIAGNOSIS — S5001XA Contusion of right elbow, initial encounter: Secondary | ICD-10-CM | POA: Diagnosis not present

## 2022-12-15 DIAGNOSIS — M25521 Pain in right elbow: Secondary | ICD-10-CM | POA: Diagnosis not present

## 2023-03-11 NOTE — Progress Notes (Unsigned)
Pcp, No   No chief complaint on file.   HPI:      Eileen Garcia is a 43 y.o. G4P0040 whose LMP was No LMP recorded. (Menstrual status: IUD)., presents today for ***  IUD?? Pain??/  Patient Active Problem List   Diagnosis Date Noted   History of multiple miscarriages 02/11/2018   PCOS (polycystic ovarian syndrome) 12/14/2016   Menometrorrhagia 10/12/2016    Past Surgical History:  Procedure Laterality Date   ACHILLES TENDON SURGERY Left 07/26/2018   Procedure: ACHILLES TENDON REPAIR SECONDARY;  Surgeon: Gwyneth Revels, DPM;  Location: [facility] ORS;  Service: Podiatry;  Laterality: Left;   BREAST BIOPSY Left 07/01/2021   LEFT u/s guided bx-- Reactive lymphoid tissue with acute inflammation and associated necrosis.   MYRINGOTOMY WITH TUBE PLACEMENT Bilateral 10/12/2022   Procedure: MYRINGOTOMY WITH TUBE PLACEMENT;  Surgeon: Lanell Persons, MD;  Location: [facility];  Service: ENT;  Laterality: Bilateral;   NASAL SEPTUM SURGERY  2012-2013   with Dr Elenore Rota   TYMPANOSTOMY TUBE PLACEMENT     3-4 different surgeries   WISDOM TOOTH EXTRACTION      Family History  Problem Relation Age of Onset   Hypertension Father    Migraines Paternal Grandmother    Diabetes Paternal Grandmother        type 1   Hypertension Paternal Grandmother    Uterine cancer Paternal Grandmother 60       vs ovar--no chemo/rad done   Breast cancer Neg Hx     Social History   Socioeconomic History   Marital status: Married    Spouse name: Not on file   Number of children: Not on file   Years of education: Not on file   Highest education level: Not on file  Occupational History   Not on file  Tobacco Use   Smoking status: Every Day    Current packs/day: 0.50    Average packs/day: 0.5 packs/day for 22.1 years (11.0 ttl pk-yrs)    Types: Cigarettes    Start date: 07/23/1998    Last attempt to quit: 07/22/2008   Smokeless tobacco: Never  Vaping Use   Vaping status: Never Used   Substance and Sexual Activity   Alcohol use: Yes    Comment: rare   Drug use: No   Sexual activity: Yes    Birth control/protection: I.U.D.    Comment: Skyla  Other Topics Concern   Not on file  Social History Narrative   Not on file   Social Drivers of Health   Financial Resource Strain: Not on file  Food Insecurity: Not on file  Transportation Needs: Not on file  Physical Activity: Not on file  Stress: Not on file  Social Connections: Not on file  Intimate Partner Violence: Not on file    Outpatient Medications Prior to Visit  Medication Sig Dispense Refill   cetirizine (ZYRTEC) 10 MG tablet Take 10 mg by mouth at bedtime.      ciprofloxacin-dexamethasone (CIPRODEX) OTIC suspension Place 4 drops into both ears 2 (two) times daily for 5 days. DOS 10/12/22. 7.5 mL 0   Levonorgestrel (SKYLA) 13.5 MG IUD by Intrauterine route.     valACYclovir (VALTREX) 1000 MG tablet Take 1 tablet (1,000 mg total) by mouth daily. Or TAKE 2 TABLETS BY MOUTH TWICE DAILY FOR 1 DAY AS NEEDED FOR SYMPTOMS. 90 tablet 1   No facility-administered medications prior to visit.      ROS:  Review of Systems BREAST:  No symptoms   OBJECTIVE:   Vitals:  There were no vitals taken for this visit.  Physical Exam  Results: No results found for this or any previous visit (from the past 24 hours).   Assessment/Plan: No diagnosis found.    No orders of the defined types were placed in this encounter.     No follow-ups on file.  Heinz Eckert B. Mahreen Schewe, PA-C 03/11/2023 2:28 PM

## 2023-03-12 ENCOUNTER — Ambulatory Visit (INDEPENDENT_AMBULATORY_CARE_PROVIDER_SITE_OTHER): Payer: BC Managed Care – PPO | Admitting: Obstetrics and Gynecology

## 2023-03-12 ENCOUNTER — Encounter: Payer: Self-pay | Admitting: Obstetrics and Gynecology

## 2023-03-12 VITALS — BP 143/91 | HR 77 | Ht 61.0 in | Wt 192.5 lb

## 2023-03-12 DIAGNOSIS — B9689 Other specified bacterial agents as the cause of diseases classified elsewhere: Secondary | ICD-10-CM | POA: Diagnosis not present

## 2023-03-12 DIAGNOSIS — R102 Pelvic and perineal pain: Secondary | ICD-10-CM | POA: Diagnosis not present

## 2023-03-12 DIAGNOSIS — R350 Frequency of micturition: Secondary | ICD-10-CM

## 2023-03-12 DIAGNOSIS — Z30431 Encounter for routine checking of intrauterine contraceptive device: Secondary | ICD-10-CM

## 2023-03-12 DIAGNOSIS — N760 Acute vaginitis: Secondary | ICD-10-CM

## 2023-03-12 LAB — POCT URINALYSIS DIPSTICK
Bilirubin, UA: NEGATIVE
Glucose, UA: NEGATIVE
Ketones, UA: NEGATIVE
Leukocytes, UA: NEGATIVE
Nitrite, UA: NEGATIVE
Protein, UA: NEGATIVE
Spec Grav, UA: 1.02 (ref 1.010–1.025)
pH, UA: 6 (ref 5.0–8.0)

## 2023-03-12 LAB — POCT WET PREP WITH KOH
Clue Cells Wet Prep HPF POC: POSITIVE
KOH Prep POC: POSITIVE — AB
Trichomonas, UA: NEGATIVE
Yeast Wet Prep HPF POC: NEGATIVE

## 2023-03-12 MED ORDER — METRONIDAZOLE 500 MG PO TABS
ORAL_TABLET | ORAL | 0 refills | Status: DC
Start: 2023-03-12 — End: 2023-03-20

## 2023-03-12 NOTE — Patient Instructions (Signed)
 I value your feedback and you entrusting [place] with your care. If you get a  patient survey, I would appreciate you taking the time to let us know about your experience today. Thank you! ? ? ?

## 2023-03-13 ENCOUNTER — Other Ambulatory Visit: Payer: Self-pay | Admitting: Obstetrics and Gynecology

## 2023-03-13 DIAGNOSIS — B001 Herpesviral vesicular dermatitis: Secondary | ICD-10-CM

## 2023-03-15 ENCOUNTER — Encounter: Payer: Self-pay | Admitting: Obstetrics and Gynecology

## 2023-03-20 ENCOUNTER — Other Ambulatory Visit: Payer: Self-pay

## 2023-03-20 DIAGNOSIS — B9689 Other specified bacterial agents as the cause of diseases classified elsewhere: Secondary | ICD-10-CM

## 2023-03-20 MED ORDER — METRONIDAZOLE 0.75 % VA GEL
1.0000 | Freq: Every day | VAGINAL | 0 refills | Status: DC
Start: 2023-03-20 — End: 2023-07-31

## 2023-04-16 ENCOUNTER — Other Ambulatory Visit: Payer: Self-pay | Admitting: Obstetrics and Gynecology

## 2023-04-16 DIAGNOSIS — B001 Herpesviral vesicular dermatitis: Secondary | ICD-10-CM

## 2023-04-30 ENCOUNTER — Encounter: Payer: Self-pay | Admitting: Obstetrics and Gynecology

## 2023-05-07 DIAGNOSIS — H9203 Otalgia, bilateral: Secondary | ICD-10-CM | POA: Diagnosis not present

## 2023-05-07 DIAGNOSIS — H6983 Other specified disorders of Eustachian tube, bilateral: Secondary | ICD-10-CM | POA: Diagnosis not present

## 2023-06-04 ENCOUNTER — Telehealth: Admitting: Family Medicine

## 2023-06-04 DIAGNOSIS — B9689 Other specified bacterial agents as the cause of diseases classified elsewhere: Secondary | ICD-10-CM | POA: Diagnosis not present

## 2023-06-04 DIAGNOSIS — B379 Candidiasis, unspecified: Secondary | ICD-10-CM

## 2023-06-04 DIAGNOSIS — J019 Acute sinusitis, unspecified: Secondary | ICD-10-CM | POA: Diagnosis not present

## 2023-06-04 DIAGNOSIS — T3695XA Adverse effect of unspecified systemic antibiotic, initial encounter: Secondary | ICD-10-CM | POA: Diagnosis not present

## 2023-06-04 MED ORDER — DOXYCYCLINE HYCLATE 100 MG PO TABS
100.0000 mg | ORAL_TABLET | Freq: Two times a day (BID) | ORAL | 0 refills | Status: AC
Start: 2023-06-04 — End: 2023-06-11

## 2023-06-04 MED ORDER — FLUCONAZOLE 150 MG PO TABS: 150.0000 mg | ORAL_TABLET | ORAL | 0 refills | Status: DC

## 2023-06-04 NOTE — Progress Notes (Signed)
 Virtual Visit Consent   Eileen Garcia, you are scheduled for a virtual visit with a [facility] provider today. Just as with appointments in the office, your consent must be obtained to participate. Your consent will be active for this visit and any virtual visit you may have with one of our providers in the next 365 days. If you have a MyChart account, a copy of this consent can be sent to you electronically.  As this is a virtual visit, video technology does not allow for your provider to perform a traditional examination. This may limit your provider's ability to fully assess your condition. If your provider identifies any concerns that need to be evaluated in person or the need to arrange testing (such as labs, EKG, etc.), we will make arrangements to do so. Although advances in technology are sophisticated, we cannot ensure that it will always work on either your end or our end. If the connection with a video visit is poor, the visit may have to be switched to a telephone visit. With either a video or telephone visit, we are not always able to ensure that we have a secure connection.  By engaging in this virtual visit, you consent to the provision of healthcare and authorize for your insurance to be billed (if applicable) for the services provided during this visit. Depending on your insurance coverage, you may receive a charge related to this service.  I need to obtain your verbal consent now. Are you willing to proceed with your visit today? Cadience A Pebley has provided verbal consent on 06/04/2023 for a virtual visit (video or telephone). Lanetta Pion, NP  Date: 06/04/2023 9:27 AM   Virtual Visit via Video Note   I, Lanetta Pion, connected with  Geovana Colebrook Colley  ([id number], 27-Jul-1980) on 06/04/23 at  9:15 AM EDT by a video-enabled telemedicine application and verified that I am speaking with the correct person using two identifiers.  Location: Patient: Virtual Visit Location Patient:  Home Provider: Virtual Visit Location Provider: Home Office   I discussed the limitations of evaluation and management by telemedicine and the availability of in person appointments. The patient expressed understanding and agreed to proceed.    History of Present Illness: Eileen Garcia is a 43 y.o. who identifies as a female who was assigned female at birth, and is being seen today for sinus infection.  Onset was 2 weeks ago- with allergies starting up  Associated symptoms are congestion, cough, sinus pressure, ear pressure and muffled sound Modifying factors are zyrtec and swapped to allerga D, mucinex, neti pot, afrin Denies chest pain, shortness of breath, fevers, chills   Problems:  Patient Active Problem List   Diagnosis Date Noted   History of multiple miscarriages 02/11/2018   PCOS (polycystic ovarian syndrome) 12/14/2016   Menometrorrhagia 10/12/2016    Allergies:  Allergies  Allergen Reactions   Imipramine Other (See Comments)    Difficulty swallowing   Phentermine Palpitations   Latex     Gloves-welps on hands   Meloxicam Nausea Only    Severe headache   Penicillins     Did it involve swelling of the face/tongue/throat, SOB, or low BP? Unknown Did it involve sudden or severe rash/hives, skin peeling, or any reaction on the inside of your mouth or nose? Unknown Did you need to seek medical attention at a hospital or doctor's office? Unknown When did it last happen?      Unknown If all above answers are "  NO", may proceed with cephalosporin use  Unknown childhood reaction    Sulfa Antibiotics     Unknown childhood reaction   Tape Rash    welts   Medications:  Current Outpatient Medications:    cetirizine (ZYRTEC) 10 MG tablet, Take 10 mg by mouth at bedtime. , Disp: , Rfl:    ciprofloxacin -dexamethasone  (CIPRODEX ) OTIC suspension, Place 4 drops into both ears 2 (two) times daily for 5 days. DOS 10/12/22., Disp: 7.5 mL, Rfl: 0   Levonorgestrel  (SKYLA ) 13.5 MG  IUD, by Intrauterine route., Disp: , Rfl:    metroNIDAZOLE  (METROGEL ) 0.75 % vaginal gel, Place 1 Applicatorful vaginally at bedtime. Apply one applicatorful to vagina at bedtime for 5 days, Disp: 70 g, Rfl: 0   valACYclovir  (VALTREX ) 1000 MG tablet, Take 1 tablet (1,000 mg total) by mouth daily. Or TAKE 2 TABLETS BY MOUTH TWICE DAILY FOR 1 DAY AS NEEDED FOR SYMPTOMS., Disp: 90 tablet, Rfl: 1  Observations/Objective: Patient is well-developed, well-nourished in no acute distress.  Resting comfortably  at home.  Head is normocephalic, atraumatic.  No labored breathing.  Speech is clear and coherent with logical content.  Patient is alert and oriented at baseline.    Assessment and Plan:  1. Acute bacterial sinusitis (Primary)  - doxycycline (VIBRA-TABS) 100 MG tablet; Take 1 tablet (100 mg total) by mouth 2 (two) times daily for 7 days.  Dispense: 14 tablet; Refill: 0  2. Antibiotic-induced yeast infection  - fluconazole (DIFLUCAN) 150 MG tablet; Take 1 tablet (150 mg total) by mouth as directed. Repeat in 3 days as needed  Dispense: 2 tablet; Refill: 0  URI recommendations: - Increased rest - Increasing Fluids - Acetaminophen  / ibuprofen as needed for fever/pain.  - Salt water gargling, chloraseptic spray and throat lozenges - Mucinex if mucus is present and increasing.  - Saline nasal spray if congestion or if nasal passages feel dry. - Humidifying the air.     Reviewed side effects, risks and benefits of medication.    Patient acknowledged agreement and understanding of the plan.   Past Medical, Surgical, Social History, Allergies, and Medications have been Reviewed.     Follow Up Instructions: I discussed the assessment and treatment plan with the patient. The patient was provided an opportunity to ask questions and all were answered. The patient agreed with the plan and demonstrated an understanding of the instructions.  A copy of instructions were sent to the patient  via MyChart unless otherwise noted below.    The patient was advised to call back or seek an in-person evaluation if the symptoms worsen or if the condition fails to improve as anticipated.    Lanetta Pion, NP

## 2023-06-04 NOTE — Patient Instructions (Addendum)
 Eileen Garcia, thank you for joining Eileen Pion, NP for today's virtual visit.  While this provider is not your primary care provider (PCP), if your PCP is located in our provider database this encounter information will be shared with them immediately following your visit.   Eileen Garcia Garcia account gives you access to today's visit and all your visits, tests, and labs performed at [facility] " click here if you don't have Eileen Eileen Garcia account or go to [email or web address]  Consent: (Patient) Eileen Garcia provided verbal consent for this virtual visit at the beginning of the encounter.  Current Medications:  Current Outpatient Medications:    doxycycline (VIBRA-TABS) 100 MG tablet, Take 1 tablet (100 mg total) by mouth 2 (two) times daily for 7 days., Disp: 14 tablet, Rfl: 0   fluconazole (DIFLUCAN) 150 MG tablet, Take 1 tablet (150 mg total) by mouth as directed. Repeat in 3 days as needed, Disp: 2 tablet, Rfl: 0   cetirizine (ZYRTEC) 10 MG tablet, Take 10 mg by mouth at bedtime. , Disp: , Rfl:    ciprofloxacin -dexamethasone  (CIPRODEX ) OTIC suspension, Place 4 drops into both ears 2 (two) times daily for 5 days. DOS 10/12/22., Disp: 7.5 mL, Rfl: 0   Levonorgestrel  (SKYLA ) 13.5 MG IUD, by Intrauterine route., Disp: , Rfl:    metroNIDAZOLE  (METROGEL ) 0.75 % vaginal gel, Place 1 Applicatorful vaginally at bedtime. Apply one applicatorful to vagina at bedtime for 5 days, Disp: 70 g, Rfl: 0   valACYclovir  (VALTREX ) 1000 MG tablet, Take 1 tablet (1,000 mg total) by mouth daily. Or TAKE 2 TABLETS BY MOUTH TWICE DAILY FOR 1 DAY AS NEEDED FOR SYMPTOMS., Disp: 90 tablet, Rfl: 1   Medications ordered in this encounter:  Meds ordered this encounter  Medications   doxycycline (VIBRA-TABS) 100 MG tablet    Sig: Take 1 tablet (100 mg total) by mouth 2 (two) times daily for 7 days.    Dispense:  14 tablet    Refill:  0    Supervising Provider:   Corine Dice  [[id number]]   fluconazole (DIFLUCAN) 150 MG tablet    Sig: Take 1 tablet (150 mg total) by mouth as directed. Repeat in 3 days as needed    Dispense:  2 tablet    Refill:  0    Supervising Provider:   LAMPTEY, PHILIP O [[id number]]     *If you need refills on other medications prior to your next appointment, please contact your pharmacy*  Follow-Up: Call back or seek an in-person evaluation if the symptoms worsen or if the condition fails to improve as anticipated.  [facility] [phone]  Other Instructions   URI recommendations: - Increased rest - Increasing Fluids - Acetaminophen  / ibuprofen as needed for fever/pain.  - Salt water gargling, chloraseptic spray and throat lozenges - Mucinex if mucus is present and increasing.  - Saline nasal spray if congestion or if nasal passages feel dry. - Humidifying the air.   If you have been instructed to have an in-person evaluation today at E[facility]en local Urgent Care facility, please use the link below. It will take you to Eileen list of all of our available [facility], including address, phone number and hours of operation. Please do not delay care.  [facility]  If you or Eileen family member do not have Eileen primary care provider, use the link below to schedule Eileen visit and establish care. When you choose  [facility] [place] primary care physician or advanced practice provider, you gain Eileen long-term partner in health. Find Eileen Primary Care Provider  Learn more about [facility]'s in-office and virtual care options: [facility] - Get Care Now

## 2023-06-12 NOTE — Addendum Note (Signed)
 Addended by: Lanetta Pion on: 06/12/2023 03:03 PM   Modules accepted: Level of Service

## 2023-06-19 ENCOUNTER — Encounter: Payer: Self-pay | Admitting: Obstetrics and Gynecology

## 2023-06-19 MED ORDER — FLUCONAZOLE 150 MG PO TABS: 150.0000 mg | ORAL_TABLET | Freq: Once | ORAL | 0 refills | Status: AC

## 2023-07-11 ENCOUNTER — Ambulatory Visit
Admission: RE | Admit: 2023-07-11 | Discharge: 2023-07-11 | Disposition: A | Discharge status: Home / Self Care | Payer: BC Managed Care – PPO | Source: Ambulatory Visit | Attending: Obstetrics and Gynecology | Admitting: Obstetrics and Gynecology

## 2023-07-11 DIAGNOSIS — Z1231 Encounter for screening mammogram for malignant neoplasm of breast: Secondary | ICD-10-CM | POA: Diagnosis not present

## 2023-07-17 ENCOUNTER — Ambulatory Visit: Payer: Self-pay | Admitting: Obstetrics and Gynecology

## 2023-07-31 ENCOUNTER — Ambulatory Visit: Payer: BC Managed Care – PPO | Admitting: Dermatology

## 2023-07-31 DIAGNOSIS — Z1283 Encounter for screening for malignant neoplasm of skin: Secondary | ICD-10-CM

## 2023-07-31 DIAGNOSIS — I781 Nevus, non-neoplastic: Secondary | ICD-10-CM

## 2023-07-31 DIAGNOSIS — L814 Other melanin hyperpigmentation: Secondary | ICD-10-CM

## 2023-07-31 DIAGNOSIS — D1801 Hemangioma of skin and subcutaneous tissue: Secondary | ICD-10-CM

## 2023-07-31 DIAGNOSIS — Z86018 Personal history of other benign neoplasm: Secondary | ICD-10-CM

## 2023-07-31 DIAGNOSIS — L578 Other skin changes due to chronic exposure to nonionizing radiation: Secondary | ICD-10-CM

## 2023-07-31 DIAGNOSIS — L719 Rosacea, unspecified: Secondary | ICD-10-CM

## 2023-07-31 DIAGNOSIS — L821 Other seborrheic keratosis: Secondary | ICD-10-CM

## 2023-07-31 DIAGNOSIS — D229 Melanocytic nevi, unspecified: Secondary | ICD-10-CM

## 2023-07-31 DIAGNOSIS — W908XXD Exposure to other nonionizing radiation, subsequent encounter: Secondary | ICD-10-CM

## 2023-07-31 DIAGNOSIS — L739 Follicular disorder, unspecified: Secondary | ICD-10-CM

## 2023-07-31 MED ORDER — CLINDAMYCIN PHOSPHATE 1 % EX LOTN: TOPICAL_LOTION | CUTANEOUS | 11 refills | Status: AC

## 2023-07-31 NOTE — Patient Instructions (Addendum)
 Recommend OTC benzoyl peroxide cleanser, wash affected areas daily in shower, let sit several minutes prior to rinsing.  May bleach towels if not rinsed off completely.  Recommended brands include Panoxyl 4% Creamy Wash, CeraVe Acne Foaming Cream wash, or Cetaphil Gentle Clear Complexion-Clearing BPO Acne Cleanser.   Melanoma ABCDEs  Melanoma is the most dangerous type of skin cancer, and is the leading cause of death from skin disease.  You are more likely to develop melanoma if you: Have light-colored skin, light-colored eyes, or red or blond hair Spend a lot of time in the sun Tan regularly, either outdoors or in a tanning bed Have had blistering sunburns, especially during childhood Have a close family member who has had a melanoma Have atypical moles or large birthmarks  Early detection of melanoma is key since treatment is typically straightforward and cure rates are extremely high if we catch it early.   The first sign of melanoma is often a change in a mole or a new dark spot.  The ABCDE system is a way of remembering the signs of melanoma.  A for asymmetry:  The two halves do not match. B for border:  The edges of the growth are irregular. C for color:  A mixture of colors are present instead of an even brown color. D for diameter:  Melanomas are usually (but not always) greater than 6mm - the size of a pencil eraser. E for evolution:  The spot keeps changing in size, shape, and color.  Please check your skin once per month between visits. You can use a small mirror in front and a large mirror behind you to keep an eye on the back side or your body.   If you see any new or changing lesions before your next follow-up, please call to schedule a visit.  Please continue daily skin protection including broad spectrum sunscreen SPF 30+ to sun-exposed areas, reapplying every 2 hours as needed when you're outdoors.   Staying in the shade or wearing long sleeves, sun glasses (UVA+UVB  protection) and wide brim hats (4-inch brim around the entire circumference of the hat) are also recommended for sun protection.    Due to recent changes in healthcare laws, you may see results of your pathology and/or laboratory studies on MyChart before the doctors have had a chance to review them. We understand that in some cases there may be results that are confusing or concerning to you. Please understand that not all results are received at the same time and often the doctors may need to interpret multiple results in order to provide you with the best plan of care or course of treatment. Therefore, we ask that you please give [place] 2 business days to thoroughly review all your results before contacting the office for clarification. Should we see a critical lab result, you will be contacted sooner.   If You Need Anything After Your Visit  If you have any questions or concerns for your doctor, please call our main line at [phone] and press option 4 to reach your doctor's medical assistant. If no one answers, please leave a voicemail as directed and we will return your call as soon as possible. Messages left after 4 pm will be answered the following business day.   You may also send [place] a message via MyChart. We typically respond to MyChart messages within 1-2 business days.  For prescription refills, please ask your pharmacy to contact our office. Our fax number is [phone].  If you have an urgent issue when the clinic is closed that cannot wait until the next business day, you can page your doctor at the number below.    Please note that while we do our best to be available for urgent issues outside of office hours, we are not available 24/7.   If you have an urgent issue and are unable to reach [place], you may choose to seek medical care at your doctor's office, retail clinic, urgent care center, or emergency room.  If you have a medical emergency, please immediately call 911 or go to the  emergency department.  Pager Numbers  - Dr. Gwen Pounds: [phone]  - Dr. Roseanne Reno: [phone]  - Dr. Katrinka Blazing: [phone]   In the event of inclement weather, please call our main line at [phone] for an update on the status of any delays or closures.  Dermatology Medication Tips: Please keep the boxes that topical medications come in in order to help keep track of the instructions about where and how to use these. Pharmacies typically print the medication instructions only on the boxes and not directly on the medication tubes.   If your medication is too expensive, please contact our office at [phone] option 4 or send [place] a message through MyChart.   We are unable to tell what your co-pay for medications will be in advance as this is different depending on your insurance coverage. However, we may be able to find a substitute medication at lower cost or fill out paperwork to get insurance to cover a needed medication.   If a prior authorization is required to get your medication covered by your insurance company, please allow [place] 1-2 business days to complete this process.  Drug prices often vary depending on where the prescription is filled and some pharmacies may offer cheaper prices.  The website www.goodrx.com contains coupons for medications through different pharmacies. The prices here do not account for what the cost may be with help from insurance (it may be cheaper with your insurance), but the website can give you the price if you did not use any insurance.  - You can print the associated coupon and take it with your prescription to the pharmacy.  - You may also stop by our office during regular business hours and pick up a GoodRx coupon card.  - If you need your prescription sent electronically to a different pharmacy, notify our office through [facility] or by phone at [phone] option 4.     Si Usted Necesita Algo Despus de Su Visita  Tambin puede  enviarnos un mensaje a travs de Clinical cytogeneticist. Por lo general respondemos a los mensajes de MyChart en el transcurso de 1 a 2 das hbiles.  Para renovar recetas, por favor pida a su farmacia que se ponga en contacto con nuestra oficina. Annie Sable de fax es [place] [phone].  Si tiene un asunto urgente cuando la clnica est cerrada y que no puede esperar hasta el siguiente da hbil, puede llamar/localizar a su doctor(a) al nmero que aparece a continuacin.   Por favor, tenga en cuenta que aunque hacemos todo lo posible para estar disponibles para asuntos urgentes fuera del horario de [place], no estamos disponibles las 24 horas del da, los 7 [address]  [address] de la [place].   Si tiene un problema urgente y no puede comunicarse con nosotros, puede optar por buscar atencin mdica  en el consultorio de su doctor(a), en una clnica privada, en un centro de atencin urgente o  en una sala de emergencias.  Si tiene Engineer, drilling, por favor llame inmediatamente al 911 o vaya a la sala de emergencias.  Nmeros de bper  - Dr. Gwen Pounds: [phone]  - Dra. Roseanne Reno: [phone]  - Dr. Katrinka Blazing: [phone]   En caso de inclemencias del tiempo, por favor llame a Lacy Duverney principal al [phone] para una actualizacin sobre el [place] de cualquier retraso o cierre.  Consejos para la medicacin en dermatologa: Por favor, guarde las cajas en las que vienen los medicamentos de uso tpico para ayudarle a seguir las instrucciones sobre dnde y cmo usarlos. Las farmacias generalmente imprimen las instrucciones del medicamento slo en las cajas y no directamente en los tubos del [place].   Si su medicamento es muy caro, por favor, pngase en contacto con Rolm Gala llamando al [phone] y presione la opcin 4 o envenos un mensaje a travs de Clinical cytogeneticist.   No podemos decirle cul ser su copago por los medicamentos por adelantado ya que esto es diferente dependiendo de la cobertura de su  seguro. Sin embargo, es posible que podamos encontrar un medicamento sustituto a Audiological scientist un formulario para que el seguro cubra el medicamento que se considera necesario.   Si se requiere una autorizacin previa para que su compaa de seguros [place] su medicamento, por favor permtanos de 1 a 2 das hbiles para completar [address]  Los precios de los medicamentos varan con frecuencia dependiendo del Environmental consultant de dnde se surte la receta y alguna farmacias pueden ofrecer precios ms baratos.  El sitio web www.goodrx.com tiene cupones para medicamentos de Health and safety inspector. Los precios aqu no tienen en cuenta lo que podra costar con la ayuda del seguro (puede ser ms barato con su seguro), pero el sitio web puede darle el precio si no utiliz Tourist information centre manager.  - Puede imprimir el cupn correspondiente y llevarlo con su receta a la farmacia.  - Tambin puede pasar por nuestra oficina durante el horario de atencin regular y Education officer, museum una tarjeta de cupones de GoodRx.  - Si necesita que su receta se enve electrnicamente a una farmacia diferente, informe a nuestra oficina a travs de MyChart de Trainer o por telfono llamando al [phone] y presione la opcin 4.

## 2023-07-31 NOTE — Progress Notes (Signed)
 Follow-Up Visit   Subjective  Eileen Garcia is a 43 y.o. female who presents for the following: Skin Cancer Screening and Full Body Skin Exam  The patient presents for Total-Body Skin Exam (TBSE) for skin cancer screening and mole check. The patient has spots, moles and lesions to be evaluated, some may be new or changing    The following portions of the chart were reviewed this encounter and updated as appropriate: medications, allergies, medical history  Review of Systems:  No other skin or systemic complaints except as noted in HPI or Assessment and Plan.  Objective  Well appearing patient in no apparent distress; mood and affect are within normal limits.  A full examination was performed including scalp, head, eyes, ears, nose, lips, neck, chest, axillae, abdomen, back, buttocks, bilateral upper extremities, bilateral lower extremities, hands, feet, fingers, toes, fingernails, and toenails. All findings within normal limits unless otherwise noted below.   Relevant physical exam findings are noted in the Assessment and Plan.    Assessment & Plan   SKIN CANCER SCREENING PERFORMED TODAY.  ACTINIC DAMAGE - Chronic condition, secondary to cumulative UV/sun exposure - diffuse scaly erythematous macules with underlying dyspigmentation - Recommend daily broad spectrum sunscreen SPF 30+ to sun-exposed areas, reapply every 2 hours as needed.  - Staying in the shade or wearing long sleeves, sun glasses (UVA+UVB protection) and wide brim hats (4-inch brim around the entire circumference of the hat) are also recommended for sun protection.  - Call for new or changing lesions.  LENTIGINES, SEBORRHEIC KERATOSES, HEMANGIOMAS - Benign normal skin lesions - Benign-appearing - Call for any changes  MELANOCYTIC NEVI - Tan-brown and/or pink-flesh-colored symmetric macules and papules - 4 x 2mm two toned brown macule, darker inferior L upper arm - stable compared to previous photo - 7mm  brown papules x 2 at right lower back - stable compared to previous photo - 1.5 mm gray brown macule on the left upper knee - Benign appearing on exam today - Observation - Call clinic for new or changing moles - Recommend daily use of broad spectrum spf 30+ sunscreen to sun-exposed areas.   Benign recurrent nevus Left Mid Back 2.19mm brown macule within round white scar Biopsy proven benign, 2019. With recurrence. Observation.  ROSACEA Exam Mild erythema at nose and upper forehead.   Chronic and persistent condition with duration or expected duration over one year. Condition is bothersome/symptomatic for patient. Currently flared.  Rosacea is a chronic progressive skin condition usually affecting the face of adults, causing redness and/or acne bumps. It is treatable but not curable. It sometimes affects the eyes (ocular rosacea) as well. It may respond to topical and/or systemic medication and can flare with stress, sun exposure, alcohol, exercise, topical steroids (including hydrocortisone/cortisone 10) and some foods.  Daily application of broad spectrum spf 30+ sunscreen to face is recommended to reduce flares.   Treatment Plan Not bothersome to pt, no treatment. Patient will call if worsens.   CONGENITAL NEVUS Exam:  1.1cm speckled brown macule at sacrum, present for years with no change per pt, stable compared to previous photo.    Treatment Plan: Benign appearing on exam today. Recommend observation. Call clinic for new or changing moles. Recommend daily use of broad spectrum spf 30+ sunscreen to sun-exposed areas.   History of Dysplastic Nevi - No evidence of recurrence today - Recommend regular full body skin exams - Recommend daily broad spectrum sunscreen SPF 30+ to sun-exposed areas, reapply every 2 hours as  needed.  - Call if any new or changing lesions are noted between office visits  TELANGIECTASIA Exam: dilated blood vessel at right upper arm.  Treatment  Plan: Benign appearing on exam Call for changes  FOLLICULITIS Exam: Follicular papules posterior and anterior thighs.  Pt states worse after shaving  Chronic and persistent condition with duration or expected duration over one year. Condition is symptomatic/ bothersome to patient. Not currently at goal.   Folliculitis occurs due to inflammation of the superficial hair follicle (pore), resulting in acne-like lesions (pus bumps). It can be infectious (bacterial, fungal) or noninfectious (shaving, tight clothing, heat/sweat, medications).  Folliculitis can be acute or chronic and recommended treatment depends on the underlying cause of folliculitis. Avoid close shave.   Treatment Plan: Start Clindamycin  lotion Apply once a day after shower/shaving Recommend benzoyl peroxide wash with shaving. Sample of CeraVe 4% BP Cleanser.  Benzoyl peroxide can cause dryness and irritation of the skin. It can also bleach fabric.  Avoid close shaving Discussed L[facility]   Return in about 1 year (around 07/30/2024) for TBSE, Hx Dysplastic Nevus.  IAndrea Kerns, CMA, am acting as scribe for Rexene Rattler, MD .   Documentation: I have reviewed the above documentation for accuracy and completeness, and I agree with the above.  Rexene Rattler, MD

## 2023-08-01 NOTE — Progress Notes (Unsigned)
   No chief complaint on file.    History of Present Illness:  Eileen Garcia is a 43 y.o. that had a Kyleena  IUD placed approximately 5 years ago. Since that time, she denies dyspareunia, pelvic pain, non-menstrual bleeding, vaginal d/c, heavy bleeding.    There were no vitals taken for this visit.  Pelvic exam:  Two IUD strings {DESC; PRESENT/ABSENT:17923} seen coming from the cervical os. EGBUS, vaginal vault and cervix: within normal limits  IUD Removal Strings of IUD identified and grasped.  IUD removed without problem with ring forceps.  Pt tolerated this well.  IUD noted to be intact.  Assessment:  No diagnosis found.    Plan: IUD removed and plan for contraception is {PLAN CONTRACEPTION:313102}. She was amenable to this plan.  Analina Filla B. Lang Zingg, PA-C 08/01/2023 5:43 PM    No chief complaint on file.    IUD PROCEDURE NOTE:  Eileen Garcia is a 43 y.o. [id number] here for {type:23561}  IUD insertion for ***   There were no vitals taken for this visit.  IUD Insertion Procedure Note Patient identified, informed consent performed, consent signed.   Discussed risks of irregular bleeding, cramping, infection, malpositioning or misplacement of the IUD outside the uterus which may require further procedure such as laparoscopy, risk of failure <1%. Time out was performed.  Urine pregnancy test negative.***  Speculum placed in the vagina.  Cervix visualized.  Cleaned with Betadine  x 2.  Grasped anteriorly with a single tooth tenaculum.  Uterus sounded to *** cm.   IUD placed per manufacturer's recommendations.  Strings trimmed to 3 cm. Tenaculum was removed, good hemostasis noted.  Patient tolerated procedure well.   ASSESSMENT:  No diagnosis found.   No orders of the defined types were placed in this encounter.    Plan:  Patient was given post-procedure instructions.  She was advised to have backup contraception for one week.   Call if you are having increasing  pain, cramps or bleeding or if you have a fever greater than 100.4 degrees F., shaking chills, nausea or vomiting. Patient was also asked to check IUD strings periodically and follow up in 4 weeks for IUD check.  No follow-ups on file.  Brogen Duell B. Adoni Greenough, PA-C 08/01/2023 5:43 PM

## 2023-08-02 ENCOUNTER — Ambulatory Visit: Admitting: Obstetrics and Gynecology

## 2023-08-02 ENCOUNTER — Encounter: Payer: Self-pay | Admitting: Obstetrics and Gynecology

## 2023-08-02 VITALS — BP 132/92 | HR 91 | Ht 61.0 in | Wt 195.0 lb

## 2023-08-02 DIAGNOSIS — Z30432 Encounter for removal of intrauterine contraceptive device: Secondary | ICD-10-CM

## 2023-08-02 DIAGNOSIS — Z30433 Encounter for removal and reinsertion of intrauterine contraceptive device: Secondary | ICD-10-CM

## 2023-08-02 DIAGNOSIS — Z30430 Encounter for insertion of intrauterine contraceptive device: Secondary | ICD-10-CM

## 2023-08-02 DIAGNOSIS — Z3202 Encounter for pregnancy test, result negative: Secondary | ICD-10-CM | POA: Diagnosis not present

## 2023-08-02 LAB — POCT URINE PREGNANCY: Preg Test, Ur: NEGATIVE

## 2023-08-02 MED ORDER — HYDROCODONE-ACETAMINOPHEN 5-325 MG PO TABS
1.0000 | ORAL_TABLET | Freq: Once | ORAL | 0 refills | Status: AC
Start: 2023-08-02 — End: 2023-08-02

## 2023-08-02 MED ORDER — MISOPROSTOL 100 MCG PO TABS: 100.0000 ug | ORAL_TABLET | Freq: Once | ORAL | 0 refills | Status: AC

## 2023-08-02 MED ORDER — ALPRAZOLAM 1 MG PO TABS: 0.5000 mg | ORAL_TABLET | Freq: Once | ORAL | 0 refills | Status: AC

## 2023-08-02 NOTE — Patient Instructions (Signed)
 I value your feedback and you entrusting [place] with your care. If you get a  patient survey, I would appreciate you taking the time to let us know about your experience today. Thank you!  Instructions after IUD insertion  Most women experience no significant problems after insertion of an IUD, however minor cramping and spotting for a few days is common. Cramps may be treated with ibuprofen 800mg  every 8 hours or Tylenol 650 mg every 4 hours. Contact [facility] immediately if you experience any of the following symptoms during the next week: temperature >99.6 degrees, worsening pelvic pain, abdominal pain, fainting, unusually heavy vaginal bleeding, foul vaginal discharge, or if you think you have expelled the IUD. Nothing inserted in the vagina for 48 hours. You will be scheduled for a follow up visit in approximately four weeks.

## 2023-08-07 ENCOUNTER — Ambulatory Visit: Admitting: Obstetrics and Gynecology

## 2023-09-28 ENCOUNTER — Ambulatory Visit
Admission: RE | Admit: 2023-09-28 | Discharge: 2023-09-28 | Disposition: A | Discharge status: Home / Self Care | Source: Ambulatory Visit | Attending: Family Medicine | Admitting: Family Medicine

## 2023-09-28 ENCOUNTER — Other Ambulatory Visit: Payer: Self-pay | Admitting: Family Medicine

## 2023-09-28 DIAGNOSIS — R519 Headache, unspecified: Secondary | ICD-10-CM | POA: Diagnosis not present

## 2023-09-28 DIAGNOSIS — G43109 Migraine with aura, not intractable, without status migrainosus: Secondary | ICD-10-CM | POA: Diagnosis not present

## 2023-09-28 DIAGNOSIS — M3500 Sicca syndrome, unspecified: Secondary | ICD-10-CM | POA: Diagnosis not present

## 2023-09-28 MED ORDER — IOHEXOL 350 MG/ML SOLN
75.0000 mL | Freq: Once | INTRAVENOUS | Status: AC | PRN
  Administered 2023-09-28: 75 mL via INTRAVENOUS

## 2023-10-04 DIAGNOSIS — M3500 Sicca syndrome, unspecified: Secondary | ICD-10-CM | POA: Diagnosis not present

## 2023-10-04 DIAGNOSIS — R519 Headache, unspecified: Secondary | ICD-10-CM | POA: Diagnosis not present

## 2023-10-09 IMAGING — US US BREAST*R* LIMITED INC AXILLA
1 series · 9 of 9 positions shown · non-contrast
Comparison: None.

CLINICAL DATA: LEFT outer breast pain and clinically appreciate
palpable area. History of PCOS and endometriosis.



[Series 1: us breast*right* limited inc axilla · 0.07mm/px · 9 of 9 slices shown]
[im 1/9]
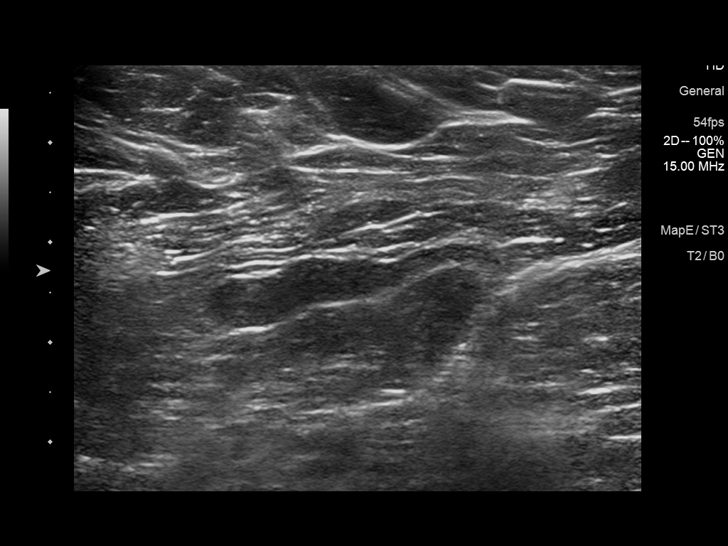
[im 2/9]
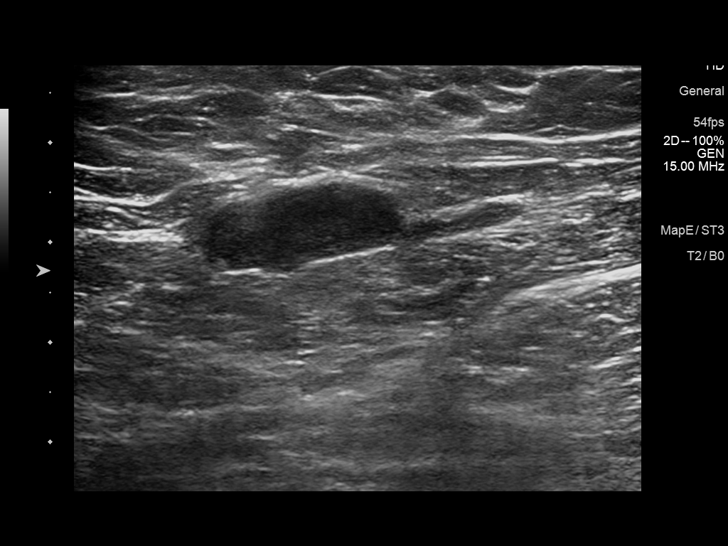
[im 3/9]
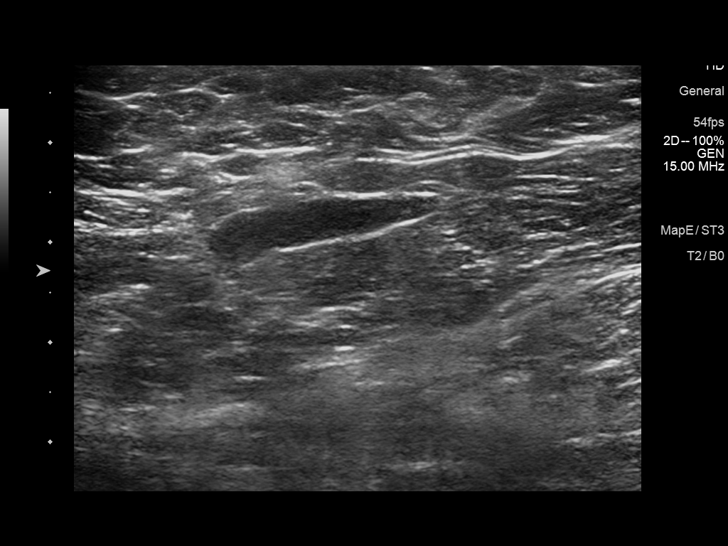
[im 4/9]
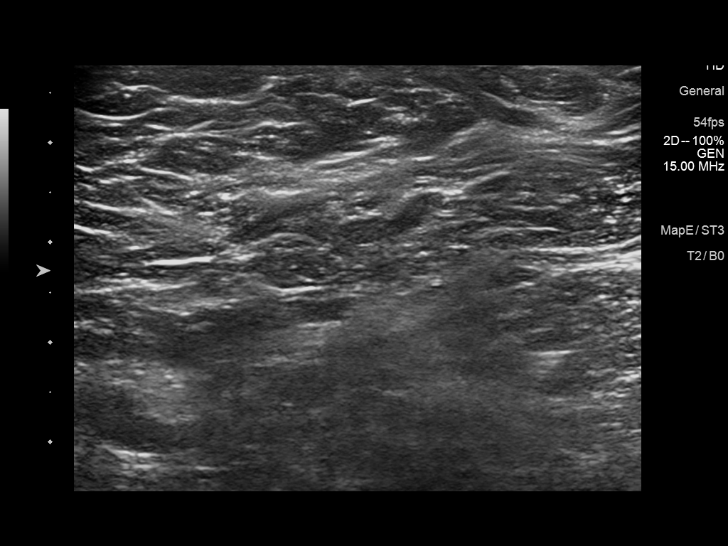
[im 5/9]
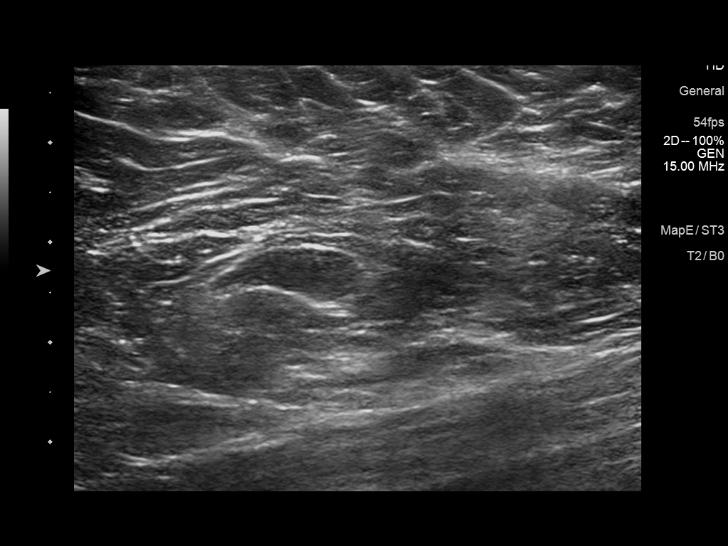
[im 6/9]
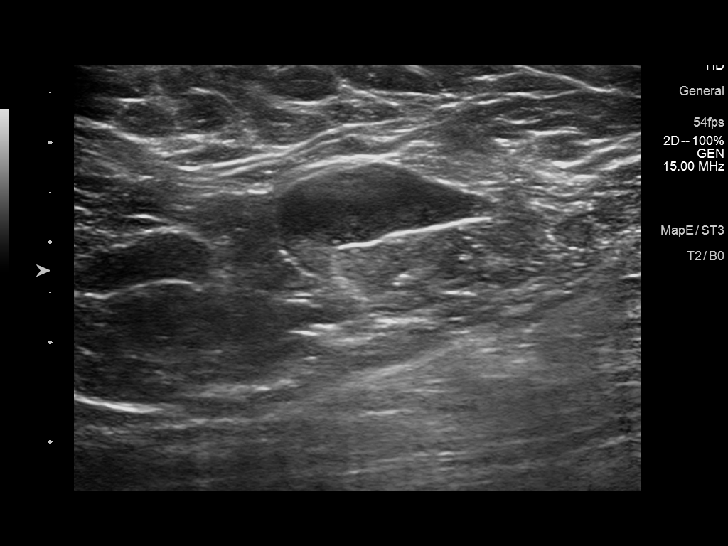
[im 7/9]
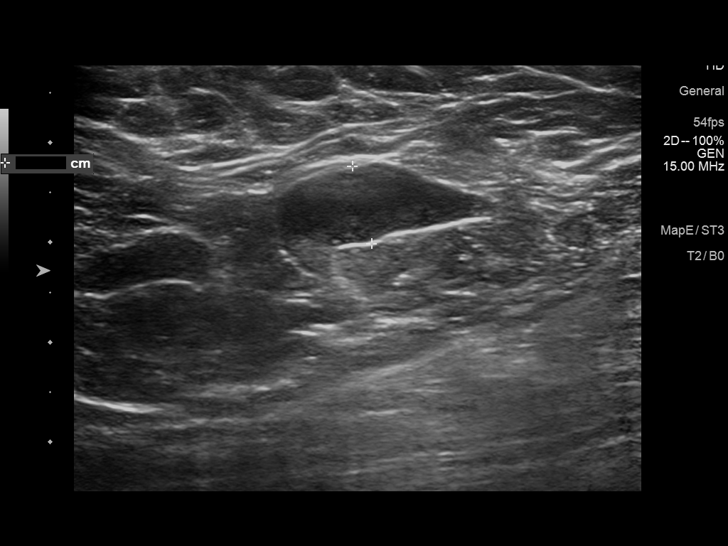
[im 8/9]
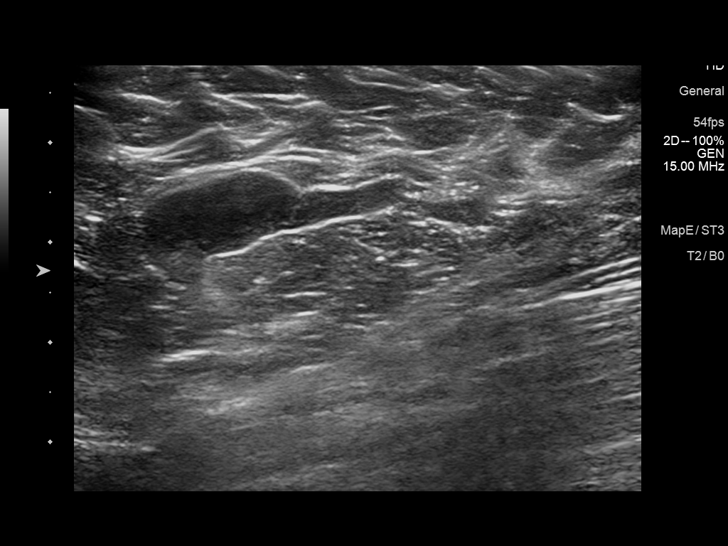
[im 9/9]
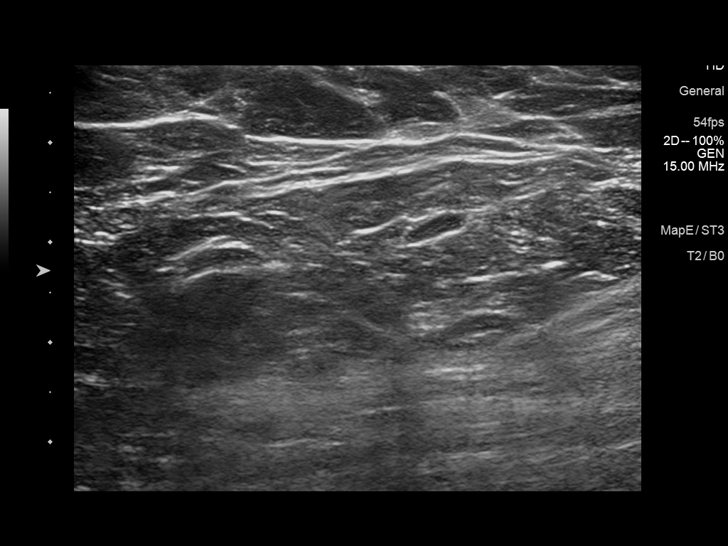

[9 of 9 positions shown; findings below may reference images not displayed]

Baseline exam.

ACR Breast Density Category c: The breast tissue is heterogeneously
dense, which may obscure small masses.
FINDINGS: In the RIGHT axilla, there are several prominent RIGHT axillary
lymph nodes. Questioned asymmetry noted on MLO view resolves with
additional views, consistent with overlapping tissue. No additional
suspicious findings noted.

In the LEFT inner breast, there is an oval circumscribed mass at
posterior depth. It is best seen on CC slice 43 with a favored
correlate on MLO slice 33. Spot compression tomosynthesis views were
obtained over the palpable/painful area of concern in the LEFT
breast. No suspicious mammographic finding is identified in this
area.

On physical exam, no suspicious mass is appreciated at the site of
painful/palpable concern.

Targeted LEFT breast ultrasound was performed in the
palpable/painful area of concern at the outer breast. No suspicious
solid or cystic mass is identified.

Targeted LEFT inner breast ultrasound was performed. No definitive
correlate for the oval circumscribed mass noted mammographically is
identified. Scattered tiny cysts were noted during real-time
examination, none of which are a definitive correlate for
mammographically identified oval mass.

Targeted LEFT axillary ultrasound was performed. Lymph nodes
demonstrate echogenic hila with normal cortical thickness of 2-3 mm.

Targeted RIGHT axillary ultrasound was performed. In the RIGHT
axilla, there are several prominent RIGHT axillary lymph nodes. They
demonstrate normal echogenic hila but diffusely smoothly thickened
cortices. These cortices measure between 5-8 mm in thickness.
Patient denies any history of recent vaccination, infection, or
trauma on the RIGHT side.
IMPRESSION: 1. Asymmetrically enlarged RIGHT axillary lymph nodes are
indeterminate. Recommend ultrasound-guided biopsy of an
asymmetrically enlarged lymph node which is amenable for biopsy for
definitive characterization to exclude underlying
lymphoproliferative process.
2. No mammographic or sonographic evidence of malignancy at the site
of painful/palpable concern in the LEFT breast. Any further workup
of the patient's symptoms should be based on the clinical
assessment.
3. There is a probably benign oval circumscribed mass in the LEFT
inner breast without a definitive sonographic correlate identified.
Recommend follow-up mammogram with ultrasound (if deemed necessary)
in 6 months. This will establish 6 months of definitive stability.

RECOMMENDATION:
1.  RIGHT axillary ultrasound-guided biopsy x1

2. LEFT diagnostic mammogram with ultrasound (if deemed necessary)
in 6 months.

I have discussed the findings and recommendations with the patient
and patient's husband. The biopsy procedure was discussed with the
patient and questions were answered. Patient expressed their
understanding of the biopsy recommendation. Patient will be
scheduled for biopsy at her earliest convenience by the schedulers.
Ordering provider will be notified. If applicable, a reminder letter
will be sent to the patient regarding the next appointment.

BI-RADS CATEGORY  4: Suspicious.

## 2023-10-09 IMAGING — MG DIGITAL DIAGNOSTIC BILAT W/ TOMO W/ CAD
7 of 12 series · 7 of 36 positions shown · non-contrast
Comparison: None.

CLINICAL DATA: LEFT outer breast pain and clinically appreciate
palpable area. History of PCOS and endometriosis.



[R CC synth-2D]
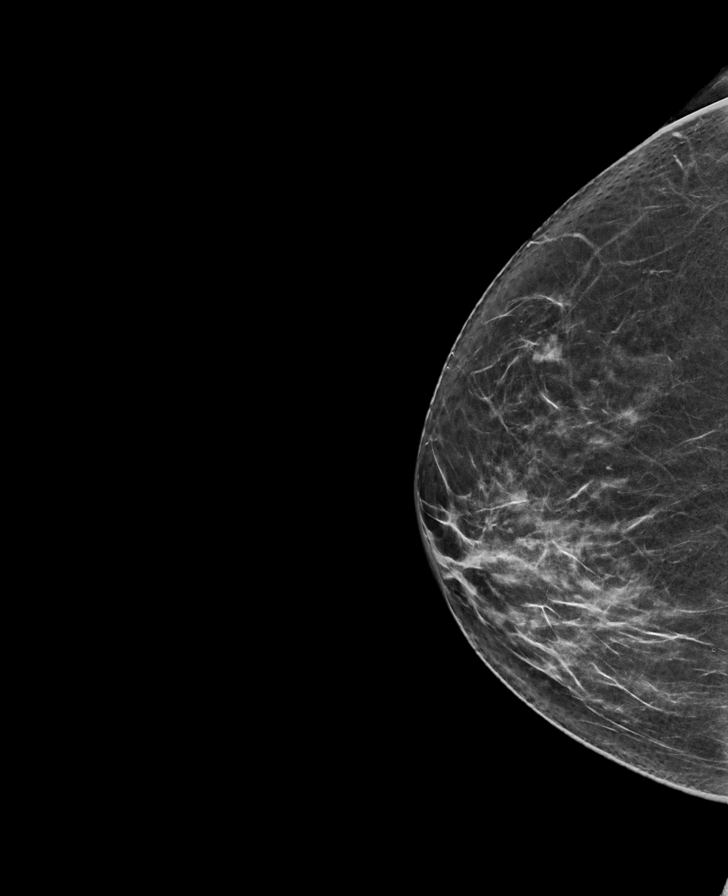

[L TAN synth-2D]
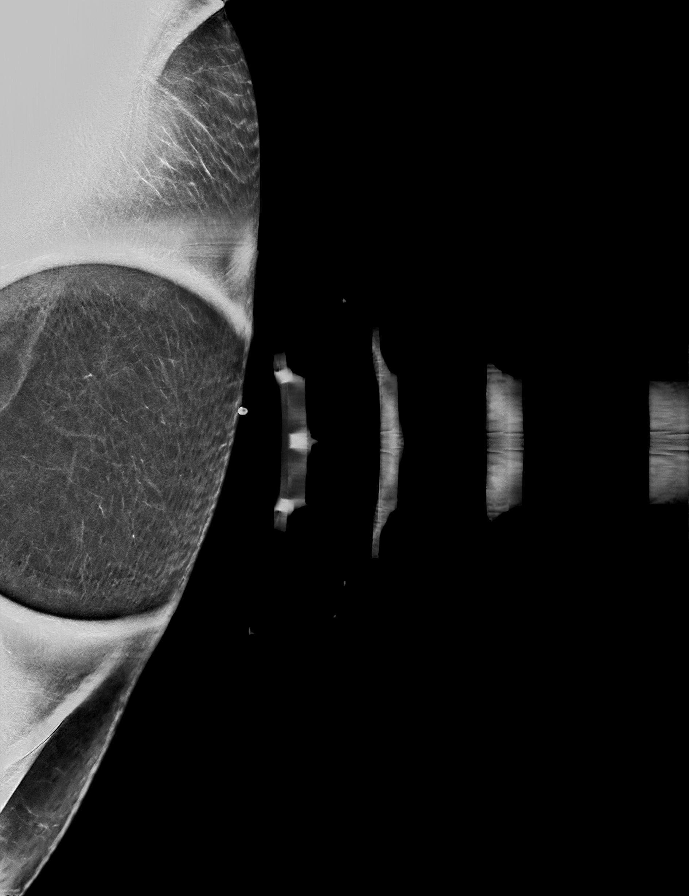

[R MLO synth-2D (1 of 2)]
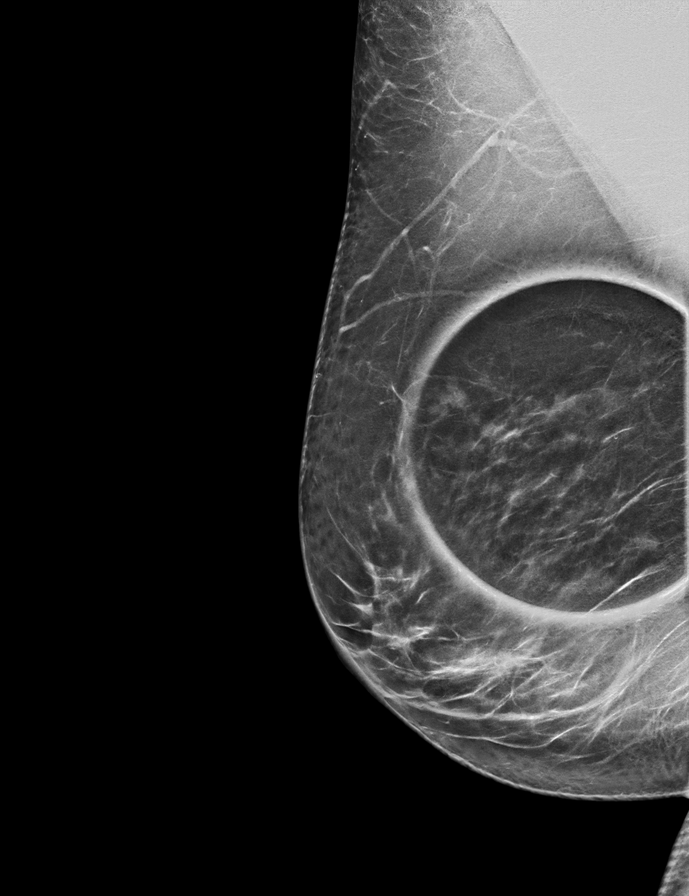

[L MLO synth-2D]
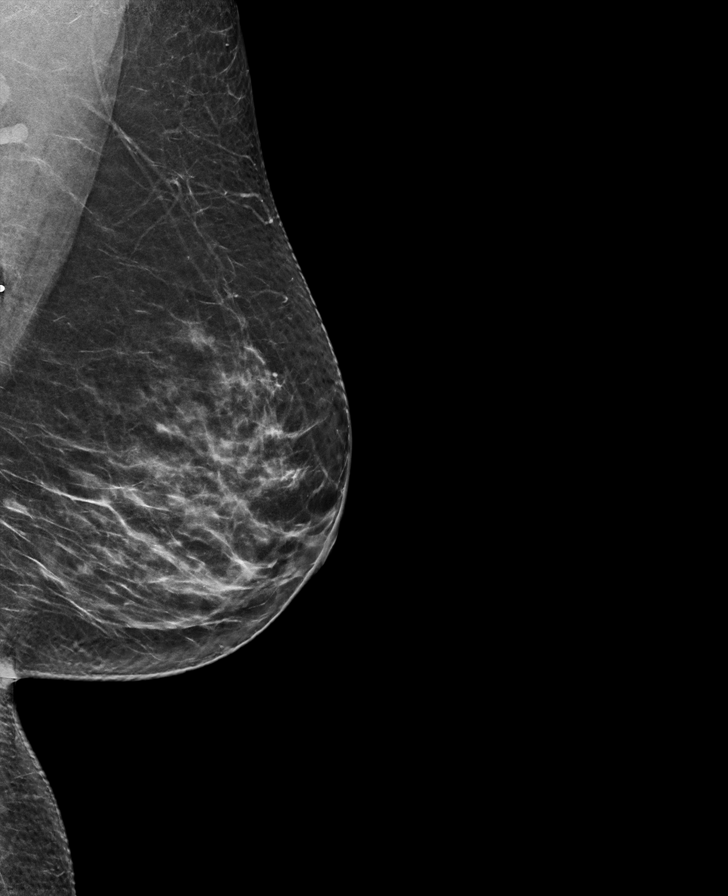

[L CC synth-2D]
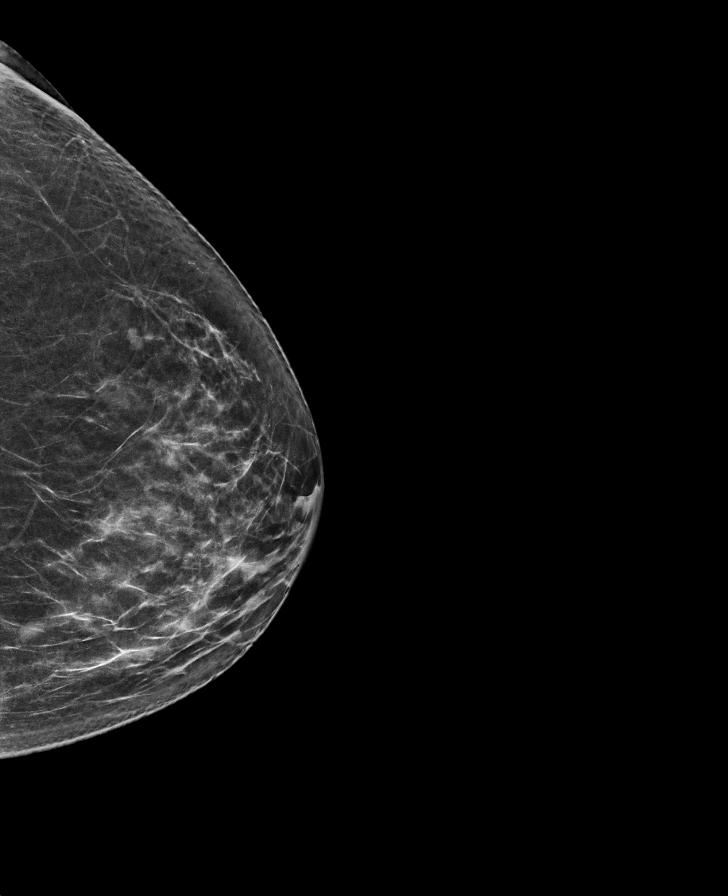

[R MLO synth-2D (2 of 2)]
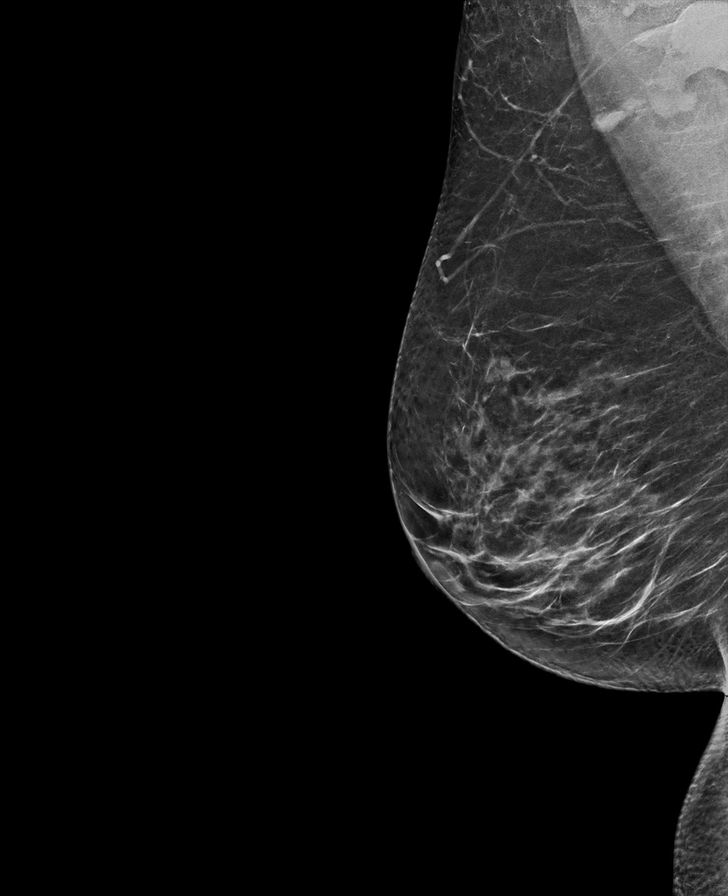

[L CC tomo · tomo slice 34/67.0]
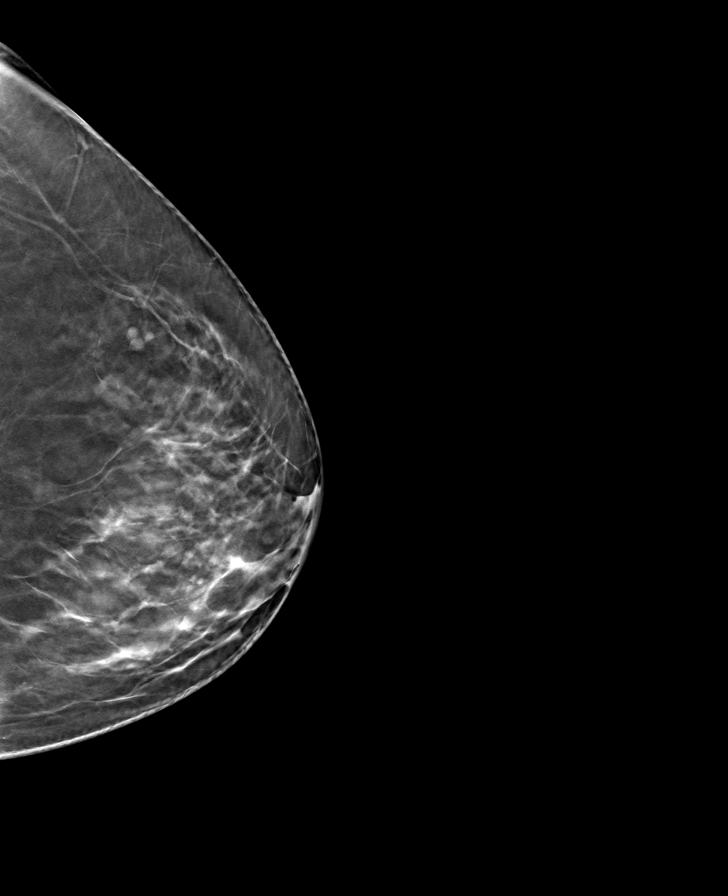

[7 of 36 positions shown; findings below may reference images not displayed]

Baseline exam.

ACR Breast Density Category c: The breast tissue is heterogeneously
dense, which may obscure small masses.
FINDINGS: In the RIGHT axilla, there are several prominent RIGHT axillary
lymph nodes. Questioned asymmetry noted on MLO view resolves with
additional views, consistent with overlapping tissue. No additional
suspicious findings noted.

In the LEFT inner breast, there is an oval circumscribed mass at
posterior depth. It is best seen on CC slice 43 with a favored
correlate on MLO slice 33. Spot compression tomosynthesis views were
obtained over the palpable/painful area of concern in the LEFT
breast. No suspicious mammographic finding is identified in this
area.

On physical exam, no suspicious mass is appreciated at the site of
painful/palpable concern.

Targeted LEFT breast ultrasound was performed in the
palpable/painful area of concern at the outer breast. No suspicious
solid or cystic mass is identified.

Targeted LEFT inner breast ultrasound was performed. No definitive
correlate for the oval circumscribed mass noted mammographically is
identified. Scattered tiny cysts were noted during real-time
examination, none of which are a definitive correlate for
mammographically identified oval mass.

Targeted LEFT axillary ultrasound was performed. Lymph nodes
demonstrate echogenic hila with normal cortical thickness of 2-3 mm.

Targeted RIGHT axillary ultrasound was performed. In the RIGHT
axilla, there are several prominent RIGHT axillary lymph nodes. They
demonstrate normal echogenic hila but diffusely smoothly thickened
cortices. These cortices measure between 5-8 mm in thickness.
Patient denies any history of recent vaccination, infection, or
trauma on the RIGHT side.
IMPRESSION: 1. Asymmetrically enlarged RIGHT axillary lymph nodes are
indeterminate. Recommend ultrasound-guided biopsy of an
asymmetrically enlarged lymph node which is amenable for biopsy for
definitive characterization to exclude underlying
lymphoproliferative process.
2. No mammographic or sonographic evidence of malignancy at the site
of painful/palpable concern in the LEFT breast. Any further workup
of the patient's symptoms should be based on the clinical
assessment.
3. There is a probably benign oval circumscribed mass in the LEFT
inner breast without a definitive sonographic correlate identified.
Recommend follow-up mammogram with ultrasound (if deemed necessary)
in 6 months. This will establish 6 months of definitive stability.

RECOMMENDATION:
1.  RIGHT axillary ultrasound-guided biopsy x1

2. LEFT diagnostic mammogram with ultrasound (if deemed necessary)
in 6 months.

I have discussed the findings and recommendations with the patient
and patient's husband. The biopsy procedure was discussed with the
patient and questions were answered. Patient expressed their
understanding of the biopsy recommendation. Patient will be
scheduled for biopsy at her earliest convenience by the schedulers.
Ordering provider will be notified. If applicable, a reminder letter
will be sent to the patient regarding the next appointment.

BI-RADS CATEGORY  4: Suspicious.

## 2023-10-21 IMAGING — MG US AXILLARY NODE CORE BIOPSY RIGHT
1 series · 8 of 8 positions shown · non-contrast
Comparison: None Available.
COMPARISON: None Available.

Addendum:
CLINICAL DATA: 40-year-old female presenting for ultrasound-guided
biopsy of a right axillary lymph node.

EXAM:
US AXILLARY NODE CORE BIOPSY RIGHT

[Series 1: MG view · 0.06mm/px · 8 of 12 slices shown]
[im 1/12]
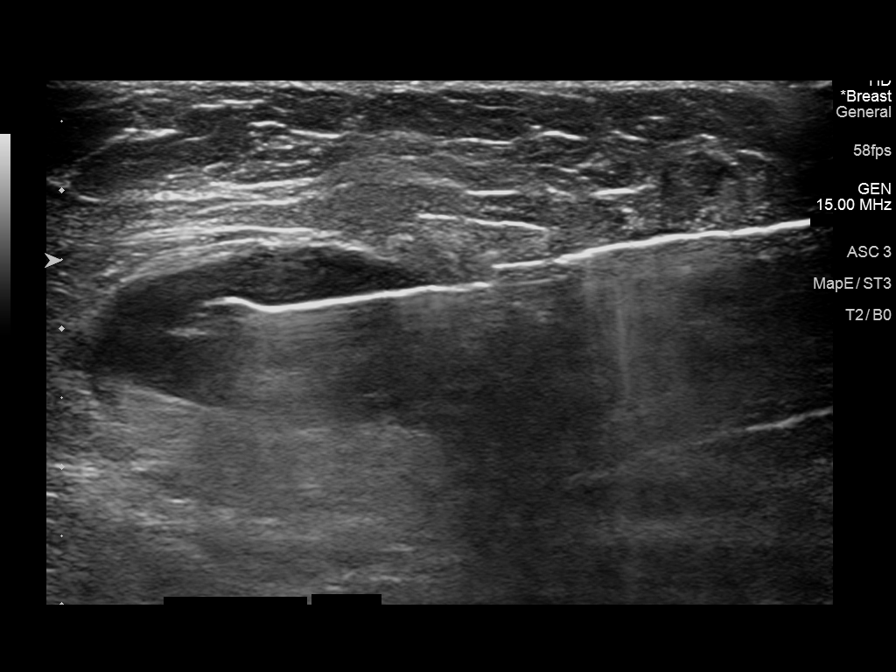
[im 2/12]
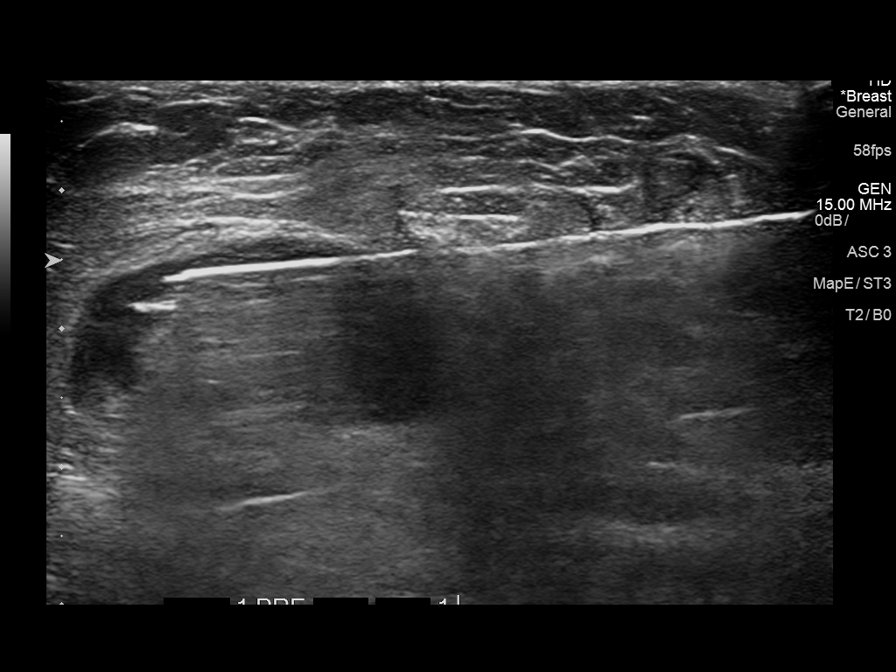
[im 4/12]
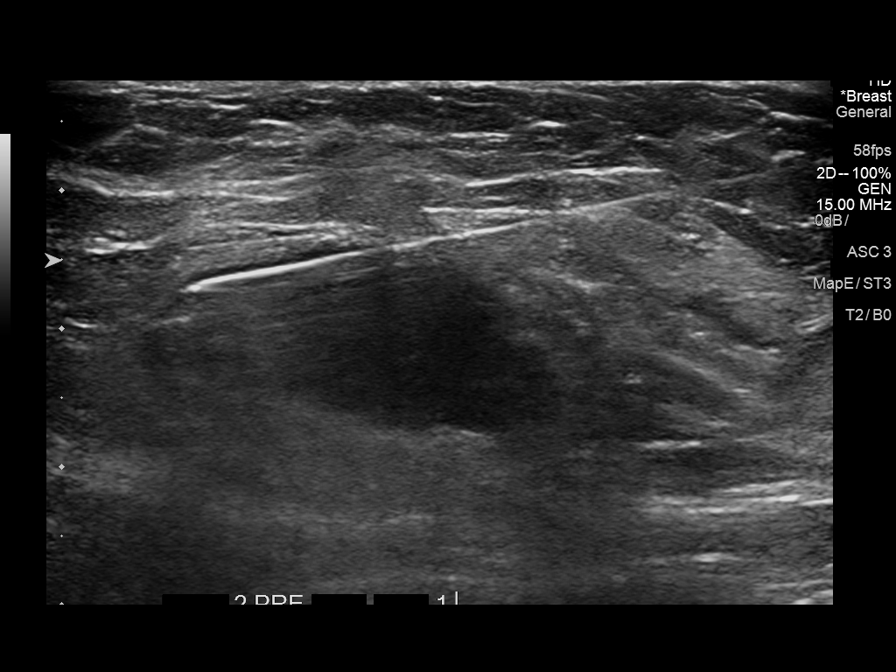
[im 5/12]
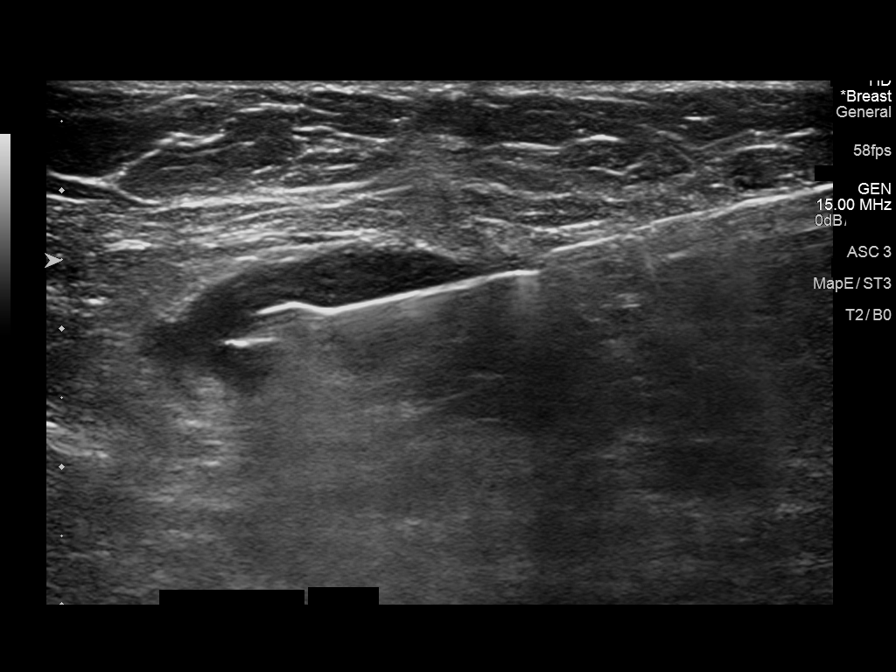
[im 7/12]
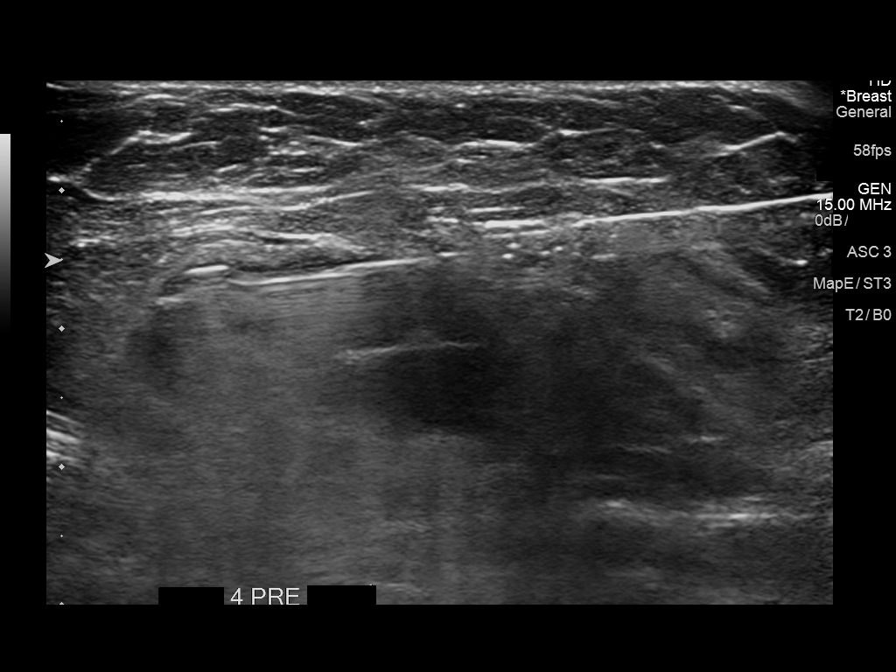
[im 8/12]
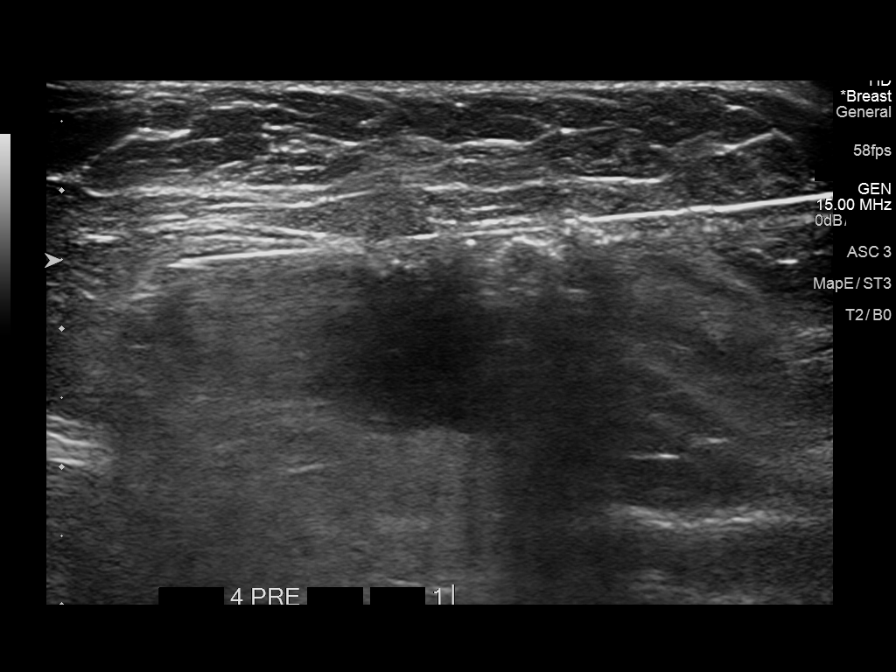
[im 10/12]
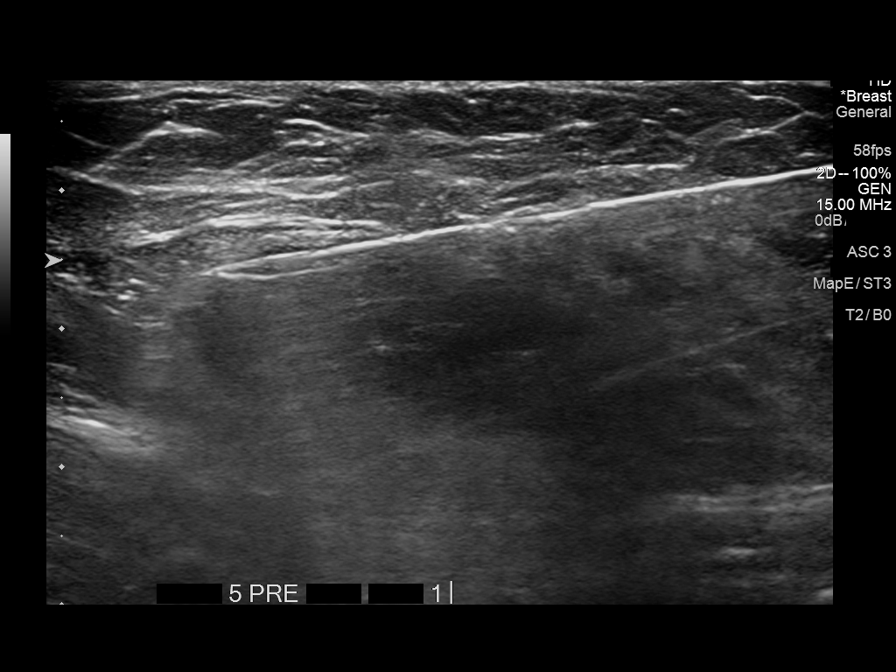
[im 12/12]
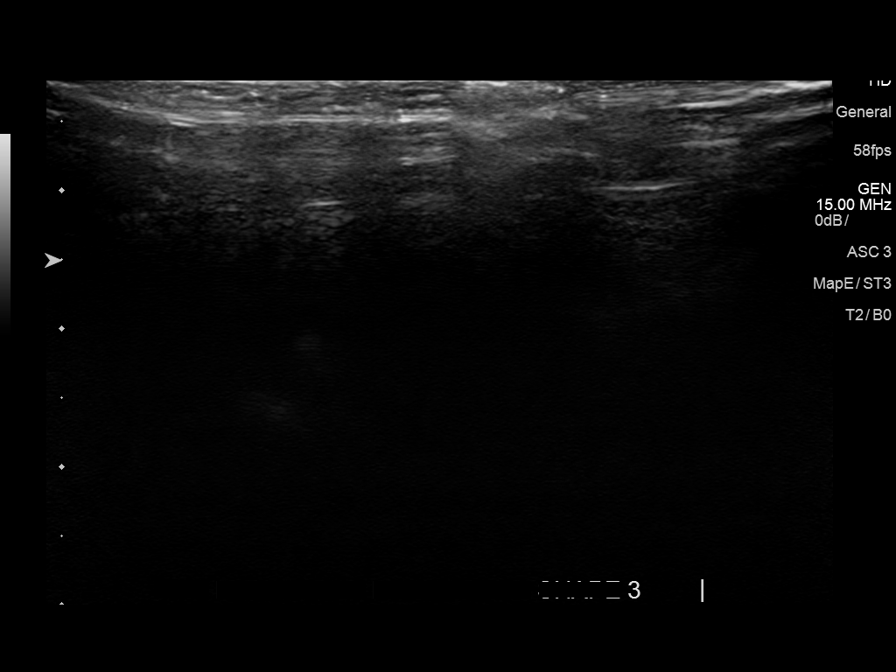

[8 of 8 positions shown; findings below may reference images not displayed]



Using sterile technique and 1% Lidocaine as local anesthetic, under
direct ultrasound visualization, a 14 gauge Oo device was
used to perform biopsy of an enlarged right axillary lymph node
using an inferior approach. At the conclusion of the procedure a
HydroMARK tissue marker clip, shape 3 was deployed into the biopsy
cavity.
IMPRESSION: Ultrasound guided biopsy of a right axillary lymph node. No apparent
complications.

ADDENDUM:
Pathology revealed: Lymph node, RIGHT axilla, core biopsy- Reactive
lymphoid tissue with acute inflammation and associated necrosis.
This was found to be concordant by Dr. Alia Tiger.

Flow cytometric analysis failed to identify a monotypic B-cell or
aberrant T-cell population in a limited sample; see details in
scanned documents in [REDACTED] record. This was found to be concordant by
Dr. Alia Tiger.

Diagnosis Comment:

There is no overt morphologic or immunophenotypic evidence of
involvement by a lymphoproliferative disorder. The findings are
suggestive of an infectious or inflammatory process. The
differential diagnosis also includes autoimmune and reactive
processes. Close clinical, microbiology, laboratory, and
radiographic correlation is recommended.

Pathology results were discussed with the patient by telephone. The
patient reported doing well after the biopsy with tenderness and
swelling at the site. The patient denies redness, fever and malaise.
She declined today being evaluated at [HOSPITAL], but
will follow up next week. Post biopsy instructions and care were
reviewed and questions were answered. The patient was encouraged to
call [HOSPITAL[facility]
for any additional concerns and also was provided my direct number.

The patient was instructed to return for LEFT diagnostic mammography
and ultrasound in 6 months and informed a reminder notice would be
sent regarding this appointment.

Pathology results reported by Rashaan Swindell RN on 06/30/2021.



Using sterile technique and 1% Lidocaine as local anesthetic, under
direct ultrasound visualization, a 14 gauge Oo device was
used to perform biopsy of an enlarged right axillary lymph node
using an inferior approach. At the conclusion of the procedure a
HydroMARK tissue marker clip, shape 3 was deployed into the biopsy
cavity.
IMPRESSION: Ultrasound guided biopsy of a right axillary lymph node. No apparent
complications.

## 2024-09-16 ENCOUNTER — Encounter: Admitting: Dermatology
